# Patient Record
Sex: Female | Born: 1950 | Race: White | Hispanic: No | Marital: Single | Smoking: Never smoker
Health system: Southern US, Community
[De-identification: ages and names within clinical notes are randomized; demographics above are authoritative.]

## PROBLEM LIST (undated history)

## (undated) DIAGNOSIS — A6000 Herpesviral infection of urogenital system, unspecified: Secondary | ICD-10-CM

## (undated) DIAGNOSIS — E785 Hyperlipidemia, unspecified: Secondary | ICD-10-CM

## (undated) DIAGNOSIS — I341 Nonrheumatic mitral (valve) prolapse: Secondary | ICD-10-CM

## (undated) DIAGNOSIS — N952 Postmenopausal atrophic vaginitis: Secondary | ICD-10-CM

## (undated) DIAGNOSIS — J302 Other seasonal allergic rhinitis: Secondary | ICD-10-CM

## (undated) DIAGNOSIS — N183 Chronic kidney disease, stage 3 unspecified: Secondary | ICD-10-CM

## (undated) DIAGNOSIS — F419 Anxiety disorder, unspecified: Secondary | ICD-10-CM

## (undated) DIAGNOSIS — F4321 Adjustment disorder with depressed mood: Secondary | ICD-10-CM

## (undated) DIAGNOSIS — N3941 Urge incontinence: Secondary | ICD-10-CM

## (undated) DIAGNOSIS — N3010 Interstitial cystitis (chronic) without hematuria: Secondary | ICD-10-CM

## (undated) DIAGNOSIS — H9313 Tinnitus, bilateral: Secondary | ICD-10-CM

## (undated) DIAGNOSIS — J309 Allergic rhinitis, unspecified: Secondary | ICD-10-CM

## (undated) DIAGNOSIS — N3281 Overactive bladder: Secondary | ICD-10-CM

## (undated) DIAGNOSIS — I700 Atherosclerosis of aorta: Secondary | ICD-10-CM

## (undated) DIAGNOSIS — U071 COVID-19: Secondary | ICD-10-CM

## (undated) DIAGNOSIS — M170 Bilateral primary osteoarthritis of knee: Secondary | ICD-10-CM

## (undated) DIAGNOSIS — N951 Menopausal and female climacteric states: Secondary | ICD-10-CM

## (undated) DIAGNOSIS — I2510 Atherosclerotic heart disease of native coronary artery without angina pectoris: Secondary | ICD-10-CM

## (undated) HISTORY — DX: Menopausal and female climacteric states: N95.1

## (undated) HISTORY — PX: TONSILLECTOMY: SUR1361

## (undated) HISTORY — DX: Interstitial cystitis (chronic) without hematuria: N30.10

## (undated) HISTORY — DX: Atherosclerotic heart disease of native coronary artery without angina pectoris: I25.10

## (undated) HISTORY — DX: Chronic kidney disease, stage 3 unspecified: N18.30

## (undated) HISTORY — DX: Nonrheumatic mitral (valve) prolapse: I34.1

## (undated) HISTORY — DX: Atherosclerosis of aorta: I70.0

## (undated) HISTORY — DX: Allergic rhinitis, unspecified: J30.9

## (undated) HISTORY — DX: Anxiety disorder, unspecified: F41.9

## (undated) HISTORY — PX: WISDOM TOOTH EXTRACTION: SHX21

## (undated) HISTORY — DX: Hyperlipidemia, unspecified: E78.5

## (undated) HISTORY — DX: Urge incontinence: N39.41

## (undated) HISTORY — DX: Herpesviral infection of urogenital system, unspecified: A60.00

## (undated) HISTORY — DX: Overactive bladder: N32.81

## (undated) HISTORY — DX: Adjustment disorder with depressed mood: F43.21

## (undated) HISTORY — DX: Tinnitus, bilateral: H93.13

## (undated) HISTORY — DX: Other seasonal allergic rhinitis: J30.2

## (undated) HISTORY — DX: Postmenopausal atrophic vaginitis: N95.2

## (undated) HISTORY — DX: COVID-19: U07.1

## (undated) HISTORY — DX: Bilateral primary osteoarthritis of knee: M17.0

---

## 1997-12-04 ENCOUNTER — Encounter: Admission: RE | Admit: 1997-12-04 | Discharge: 1998-03-04 | Payer: Self-pay | Admitting: Family Medicine

## 1998-02-06 ENCOUNTER — Ambulatory Visit (HOSPITAL_COMMUNITY): Admission: RE | Admit: 1998-02-06 | Discharge: 1998-02-06 | Payer: Self-pay | Admitting: Obstetrics and Gynecology

## 1998-04-10 ENCOUNTER — Other Ambulatory Visit: Admission: RE | Admit: 1998-04-10 | Discharge: 1998-04-10 | Payer: Self-pay | Admitting: Obstetrics and Gynecology

## 1999-02-05 ENCOUNTER — Ambulatory Visit (HOSPITAL_COMMUNITY): Admission: RE | Admit: 1999-02-05 | Discharge: 1999-02-05 | Payer: Self-pay | Admitting: Obstetrics and Gynecology

## 1999-02-05 ENCOUNTER — Encounter: Payer: Self-pay | Admitting: Obstetrics and Gynecology

## 1999-03-06 ENCOUNTER — Encounter: Admission: RE | Admit: 1999-03-06 | Discharge: 1999-06-04 | Payer: Self-pay | Admitting: Family Medicine

## 1999-06-11 ENCOUNTER — Other Ambulatory Visit: Admission: RE | Admit: 1999-06-11 | Discharge: 1999-06-11 | Payer: Self-pay | Admitting: Obstetrics and Gynecology

## 1999-11-27 ENCOUNTER — Other Ambulatory Visit: Admission: RE | Admit: 1999-11-27 | Discharge: 1999-11-27 | Payer: Self-pay | Admitting: Obstetrics and Gynecology

## 1999-11-27 ENCOUNTER — Encounter (INDEPENDENT_AMBULATORY_CARE_PROVIDER_SITE_OTHER): Payer: Self-pay

## 2000-02-24 ENCOUNTER — Encounter: Admission: RE | Admit: 2000-02-24 | Discharge: 2000-04-12 | Payer: Self-pay | Admitting: Family Medicine

## 2000-02-25 ENCOUNTER — Encounter: Payer: Self-pay | Admitting: Obstetrics and Gynecology

## 2000-02-25 ENCOUNTER — Ambulatory Visit (HOSPITAL_COMMUNITY): Admission: RE | Admit: 2000-02-25 | Discharge: 2000-02-25 | Payer: Self-pay | Admitting: Obstetrics and Gynecology

## 2000-08-18 ENCOUNTER — Other Ambulatory Visit: Admission: RE | Admit: 2000-08-18 | Discharge: 2000-08-18 | Payer: Self-pay | Admitting: Obstetrics and Gynecology

## 2001-03-01 ENCOUNTER — Encounter: Payer: Self-pay | Admitting: Obstetrics and Gynecology

## 2001-03-01 ENCOUNTER — Ambulatory Visit (HOSPITAL_COMMUNITY): Admission: RE | Admit: 2001-03-01 | Discharge: 2001-03-01 | Payer: Self-pay | Admitting: Obstetrics and Gynecology

## 2001-09-20 ENCOUNTER — Ambulatory Visit (HOSPITAL_COMMUNITY): Admission: RE | Admit: 2001-09-20 | Discharge: 2001-09-20 | Payer: Self-pay | Admitting: Internal Medicine

## 2001-09-20 ENCOUNTER — Encounter: Payer: Self-pay | Admitting: *Deleted

## 2001-12-06 ENCOUNTER — Other Ambulatory Visit: Admission: RE | Admit: 2001-12-06 | Discharge: 2001-12-06 | Payer: Self-pay | Admitting: Obstetrics and Gynecology

## 2002-03-14 ENCOUNTER — Ambulatory Visit (HOSPITAL_COMMUNITY): Admission: RE | Admit: 2002-03-14 | Discharge: 2002-03-14 | Payer: Self-pay | Admitting: Obstetrics and Gynecology

## 2002-03-14 ENCOUNTER — Encounter: Payer: Self-pay | Admitting: Obstetrics and Gynecology

## 2002-12-08 ENCOUNTER — Other Ambulatory Visit: Admission: RE | Admit: 2002-12-08 | Discharge: 2002-12-08 | Payer: Self-pay | Admitting: Obstetrics and Gynecology

## 2003-03-27 ENCOUNTER — Ambulatory Visit (HOSPITAL_COMMUNITY): Admission: RE | Admit: 2003-03-27 | Discharge: 2003-03-27 | Payer: Self-pay | Admitting: Obstetrics and Gynecology

## 2003-03-27 ENCOUNTER — Encounter: Payer: Self-pay | Admitting: Obstetrics and Gynecology

## 2003-12-19 ENCOUNTER — Emergency Department (HOSPITAL_COMMUNITY): Admission: EM | Admit: 2003-12-19 | Discharge: 2003-12-19 | Payer: Self-pay | Admitting: *Deleted

## 2004-12-18 ENCOUNTER — Ambulatory Visit (HOSPITAL_COMMUNITY): Admission: RE | Admit: 2004-12-18 | Discharge: 2004-12-18 | Payer: Self-pay | Admitting: Obstetrics and Gynecology

## 2004-12-23 ENCOUNTER — Encounter: Admission: RE | Admit: 2004-12-23 | Discharge: 2004-12-23 | Payer: Self-pay | Admitting: Obstetrics and Gynecology

## 2005-02-16 ENCOUNTER — Other Ambulatory Visit: Admission: RE | Admit: 2005-02-16 | Discharge: 2005-02-16 | Payer: Self-pay | Admitting: Obstetrics and Gynecology

## 2006-02-10 ENCOUNTER — Ambulatory Visit (HOSPITAL_COMMUNITY): Admission: RE | Admit: 2006-02-10 | Discharge: 2006-02-10 | Payer: Self-pay | Admitting: Obstetrics and Gynecology

## 2006-03-18 ENCOUNTER — Other Ambulatory Visit: Admission: RE | Admit: 2006-03-18 | Discharge: 2006-03-18 | Payer: Self-pay | Admitting: Family Medicine

## 2007-02-15 ENCOUNTER — Ambulatory Visit (HOSPITAL_COMMUNITY): Admission: RE | Admit: 2007-02-15 | Discharge: 2007-02-15 | Payer: Self-pay | Admitting: Family Medicine

## 2007-04-19 ENCOUNTER — Other Ambulatory Visit: Admission: RE | Admit: 2007-04-19 | Discharge: 2007-04-19 | Payer: Self-pay | Admitting: Family Medicine

## 2008-02-17 ENCOUNTER — Ambulatory Visit (HOSPITAL_COMMUNITY): Admission: RE | Admit: 2008-02-17 | Discharge: 2008-02-17 | Payer: Self-pay | Admitting: Family Medicine

## 2008-04-23 ENCOUNTER — Other Ambulatory Visit: Admission: RE | Admit: 2008-04-23 | Discharge: 2008-04-23 | Payer: Self-pay | Admitting: Family Medicine

## 2009-02-21 ENCOUNTER — Ambulatory Visit (HOSPITAL_COMMUNITY): Admission: RE | Admit: 2009-02-21 | Discharge: 2009-02-21 | Payer: Self-pay | Admitting: Family Medicine

## 2010-02-27 ENCOUNTER — Ambulatory Visit (HOSPITAL_COMMUNITY): Admission: RE | Admit: 2010-02-27 | Discharge: 2010-02-27 | Payer: Self-pay | Admitting: Family Medicine

## 2010-08-24 ENCOUNTER — Encounter: Payer: Self-pay | Admitting: Obstetrics and Gynecology

## 2011-01-19 ENCOUNTER — Other Ambulatory Visit (HOSPITAL_COMMUNITY): Payer: Self-pay | Admitting: Family Medicine

## 2011-01-19 DIAGNOSIS — Z1231 Encounter for screening mammogram for malignant neoplasm of breast: Secondary | ICD-10-CM

## 2011-03-02 ENCOUNTER — Ambulatory Visit (HOSPITAL_COMMUNITY)
Admission: RE | Admit: 2011-03-02 | Discharge: 2011-03-02 | Disposition: A | Discharge status: Home / Self Care | Payer: BC Managed Care – PPO | Source: Ambulatory Visit | Attending: Family Medicine | Admitting: Family Medicine

## 2011-03-02 DIAGNOSIS — Z1231 Encounter for screening mammogram for malignant neoplasm of breast: Secondary | ICD-10-CM | POA: Insufficient documentation

## 2011-04-03 ENCOUNTER — Other Ambulatory Visit: Payer: Self-pay | Admitting: Family Medicine

## 2011-04-03 ENCOUNTER — Other Ambulatory Visit (HOSPITAL_COMMUNITY)
Admission: RE | Admit: 2011-04-03 | Discharge: 2011-04-03 | Disposition: A | Discharge status: Home / Self Care | Payer: BC Managed Care – PPO | Source: Ambulatory Visit | Attending: Family Medicine | Admitting: Family Medicine

## 2011-04-03 DIAGNOSIS — Z0000 Encounter for general adult medical examination without abnormal findings: Secondary | ICD-10-CM | POA: Insufficient documentation

## 2011-04-09 ENCOUNTER — Other Ambulatory Visit (HOSPITAL_COMMUNITY): Payer: Self-pay | Admitting: Family Medicine

## 2011-04-09 DIAGNOSIS — M8580 Other specified disorders of bone density and structure, unspecified site: Secondary | ICD-10-CM

## 2011-05-04 ENCOUNTER — Ambulatory Visit (HOSPITAL_COMMUNITY)
Admission: RE | Admit: 2011-05-04 | Discharge: 2011-05-04 | Disposition: A | Discharge status: Home / Self Care | Payer: BC Managed Care – PPO | Source: Ambulatory Visit | Attending: Family Medicine | Admitting: Family Medicine

## 2011-05-04 DIAGNOSIS — Z13820 Encounter for screening for osteoporosis: Secondary | ICD-10-CM | POA: Insufficient documentation

## 2011-05-04 DIAGNOSIS — M8580 Other specified disorders of bone density and structure, unspecified site: Secondary | ICD-10-CM

## 2011-05-04 DIAGNOSIS — Z780 Asymptomatic menopausal state: Secondary | ICD-10-CM | POA: Insufficient documentation

## 2012-02-09 ENCOUNTER — Other Ambulatory Visit (HOSPITAL_COMMUNITY): Payer: Self-pay | Admitting: Family Medicine

## 2012-02-09 DIAGNOSIS — Z1231 Encounter for screening mammogram for malignant neoplasm of breast: Secondary | ICD-10-CM

## 2012-03-07 ENCOUNTER — Ambulatory Visit (HOSPITAL_COMMUNITY)
Admission: RE | Admit: 2012-03-07 | Discharge: 2012-03-07 | Disposition: A | Discharge status: Home / Self Care | Payer: BC Managed Care – PPO | Source: Ambulatory Visit | Attending: Family Medicine | Admitting: Family Medicine

## 2012-03-07 ENCOUNTER — Ambulatory Visit (HOSPITAL_COMMUNITY): Payer: BC Managed Care – PPO

## 2012-03-07 DIAGNOSIS — Z1231 Encounter for screening mammogram for malignant neoplasm of breast: Secondary | ICD-10-CM | POA: Insufficient documentation

## 2012-03-22 ENCOUNTER — Other Ambulatory Visit: Payer: Self-pay | Admitting: Family Medicine

## 2012-03-22 ENCOUNTER — Ambulatory Visit
Admission: RE | Admit: 2012-03-22 | Discharge: 2012-03-22 | Disposition: A | Discharge status: Home / Self Care | Payer: BC Managed Care – PPO | Source: Ambulatory Visit | Attending: Family Medicine | Admitting: Family Medicine

## 2012-03-22 DIAGNOSIS — M542 Cervicalgia: Secondary | ICD-10-CM

## 2012-05-05 ENCOUNTER — Other Ambulatory Visit: Payer: Self-pay | Admitting: Physician Assistant

## 2012-05-05 ENCOUNTER — Other Ambulatory Visit (HOSPITAL_COMMUNITY)
Admission: RE | Admit: 2012-05-05 | Discharge: 2012-05-05 | Disposition: A | Discharge status: Home / Self Care | Payer: BC Managed Care – PPO | Source: Ambulatory Visit | Attending: Family Medicine | Admitting: Family Medicine

## 2012-05-05 DIAGNOSIS — Z0000 Encounter for general adult medical examination without abnormal findings: Secondary | ICD-10-CM | POA: Insufficient documentation

## 2013-02-20 ENCOUNTER — Other Ambulatory Visit (HOSPITAL_COMMUNITY): Payer: Self-pay | Admitting: Family Medicine

## 2013-02-20 DIAGNOSIS — Z1231 Encounter for screening mammogram for malignant neoplasm of breast: Secondary | ICD-10-CM

## 2013-03-13 ENCOUNTER — Ambulatory Visit (HOSPITAL_COMMUNITY)
Admission: RE | Admit: 2013-03-13 | Discharge: 2013-03-13 | Disposition: A | Discharge status: Home / Self Care | Payer: BC Managed Care – PPO | Source: Ambulatory Visit | Attending: Family Medicine | Admitting: Family Medicine

## 2013-03-13 DIAGNOSIS — Z1231 Encounter for screening mammogram for malignant neoplasm of breast: Secondary | ICD-10-CM | POA: Insufficient documentation

## 2013-05-15 ENCOUNTER — Other Ambulatory Visit: Payer: Self-pay | Admitting: Family Medicine

## 2013-05-15 ENCOUNTER — Ambulatory Visit
Admission: RE | Admit: 2013-05-15 | Discharge: 2013-05-15 | Disposition: A | Discharge status: Home / Self Care | Payer: BC Managed Care – PPO | Source: Ambulatory Visit | Attending: Family Medicine | Admitting: Family Medicine

## 2013-05-15 DIAGNOSIS — R52 Pain, unspecified: Secondary | ICD-10-CM

## 2013-10-16 ENCOUNTER — Ambulatory Visit
Admission: RE | Admit: 2013-10-16 | Discharge: 2013-10-16 | Disposition: A | Discharge status: Home / Self Care | Payer: BC Managed Care – PPO | Source: Ambulatory Visit | Attending: Family Medicine | Admitting: Family Medicine

## 2013-10-16 ENCOUNTER — Other Ambulatory Visit: Payer: Self-pay | Admitting: Family Medicine

## 2013-10-16 DIAGNOSIS — R059 Cough, unspecified: Secondary | ICD-10-CM

## 2013-10-16 DIAGNOSIS — R05 Cough: Secondary | ICD-10-CM

## 2014-02-13 ENCOUNTER — Other Ambulatory Visit (HOSPITAL_COMMUNITY): Payer: Self-pay | Admitting: Family Medicine

## 2014-02-13 DIAGNOSIS — Z1231 Encounter for screening mammogram for malignant neoplasm of breast: Secondary | ICD-10-CM

## 2014-03-19 ENCOUNTER — Ambulatory Visit (HOSPITAL_COMMUNITY): Payer: BC Managed Care – PPO

## 2014-03-23 ENCOUNTER — Ambulatory Visit (HOSPITAL_COMMUNITY)
Admission: RE | Admit: 2014-03-23 | Discharge: 2014-03-23 | Disposition: A | Discharge status: Home / Self Care | Payer: BC Managed Care – PPO | Source: Ambulatory Visit | Attending: Family Medicine | Admitting: Family Medicine

## 2014-03-23 DIAGNOSIS — Z1231 Encounter for screening mammogram for malignant neoplasm of breast: Secondary | ICD-10-CM | POA: Insufficient documentation

## 2015-03-18 ENCOUNTER — Other Ambulatory Visit (HOSPITAL_COMMUNITY): Payer: Self-pay | Admitting: Family Medicine

## 2015-03-18 DIAGNOSIS — Z1231 Encounter for screening mammogram for malignant neoplasm of breast: Secondary | ICD-10-CM

## 2015-03-25 ENCOUNTER — Ambulatory Visit (HOSPITAL_COMMUNITY)
Admission: RE | Admit: 2015-03-25 | Discharge: 2015-03-25 | Disposition: A | Discharge status: Home / Self Care | Payer: BC Managed Care – PPO | Source: Ambulatory Visit | Attending: Family Medicine | Admitting: Family Medicine

## 2015-03-25 DIAGNOSIS — Z1231 Encounter for screening mammogram for malignant neoplasm of breast: Secondary | ICD-10-CM | POA: Diagnosis not present

## 2015-12-17 ENCOUNTER — Other Ambulatory Visit (HOSPITAL_COMMUNITY)
Admission: RE | Admit: 2015-12-17 | Discharge: 2015-12-17 | Disposition: A | Discharge status: Home / Self Care | Payer: BC Managed Care – PPO | Source: Ambulatory Visit | Attending: Family Medicine | Admitting: Family Medicine

## 2015-12-17 ENCOUNTER — Other Ambulatory Visit: Payer: Self-pay | Admitting: Family Medicine

## 2015-12-17 DIAGNOSIS — Z124 Encounter for screening for malignant neoplasm of cervix: Secondary | ICD-10-CM | POA: Insufficient documentation

## 2015-12-18 LAB — CYTOLOGY - PAP

## 2016-03-03 ENCOUNTER — Other Ambulatory Visit: Payer: Self-pay | Admitting: Family Medicine

## 2016-03-03 DIAGNOSIS — Z1231 Encounter for screening mammogram for malignant neoplasm of breast: Secondary | ICD-10-CM

## 2016-04-01 ENCOUNTER — Other Ambulatory Visit: Payer: Self-pay | Admitting: Family Medicine

## 2016-04-01 ENCOUNTER — Ambulatory Visit
Admission: RE | Admit: 2016-04-01 | Discharge: 2016-04-01 | Disposition: A | Discharge status: Home / Self Care | Payer: BC Managed Care – PPO | Source: Ambulatory Visit | Attending: Family Medicine | Admitting: Family Medicine

## 2016-04-01 DIAGNOSIS — Z1231 Encounter for screening mammogram for malignant neoplasm of breast: Secondary | ICD-10-CM

## 2016-08-03 HISTORY — PX: KNEE SURGERY: SHX244

## 2016-08-18 ENCOUNTER — Other Ambulatory Visit: Payer: Self-pay | Admitting: Family Medicine

## 2016-08-18 ENCOUNTER — Ambulatory Visit
Admission: RE | Admit: 2016-08-18 | Discharge: 2016-08-18 | Disposition: A | Discharge status: Home / Self Care | Payer: BC Managed Care – PPO | Source: Ambulatory Visit | Attending: Family Medicine | Admitting: Family Medicine

## 2016-08-18 DIAGNOSIS — R5383 Other fatigue: Secondary | ICD-10-CM

## 2016-08-18 DIAGNOSIS — R0981 Nasal congestion: Secondary | ICD-10-CM

## 2016-08-19 ENCOUNTER — Other Ambulatory Visit: Payer: BC Managed Care – PPO

## 2017-03-08 ENCOUNTER — Ambulatory Visit (INDEPENDENT_AMBULATORY_CARE_PROVIDER_SITE_OTHER): Payer: Medicare Other | Admitting: Sports Medicine

## 2017-03-08 VITALS — BP 108/68 | Ht 61.0 in | Wt 125.0 lb

## 2017-03-08 DIAGNOSIS — M25562 Pain in left knee: Secondary | ICD-10-CM | POA: Diagnosis not present

## 2017-03-08 NOTE — Progress Notes (Signed)
   Subjective:    Patient ID: Angel Warren, female    DOB: 05/19/1951, 66 y.o.   MRN: [id number]  HPI  L knee pain: 66 yo female presenting with left knee pain following a hiking trip.  [place].  On the first day, she stepped up onto a high rock ledge with her left leg and felt a sudden pop behind her left knee.  Felt pain radiate to the medial aspect of her knee and around to the front.  She sat down for a while to rest.  Took some Tylenol and borrowed a knee brace another hiker had.  She continued the hike for the remaining 5 days and notes her knee felt like it as going to collapse on her.  Felt "loose and unstable".  It became difficult to bear weight and this worsened with each day.  By the end of her trip, felt overall twingy in nature but not all the time.  [facility] [place] [place] realized she could not run on it and it felt a little stiff.   She has recently moved and has been unloading boxes at home but unable to bear weight.  No catching/locking sensation noted. She has been limping which is more noticeable at the end of the day. She has been icing it and elevates it at home.  Notes there is sometimes swelling in her left lower leg and foot.    Review of Systems  Cardiovascular: Positive for leg swelling.  Musculoskeletal: Negative for joint swelling.  Neurological: Negative for numbness.      Objective:   Physical Exam  BP 108/68   Ht 5\' 1"  (1.549 m)   Wt 125 lb (56.7 kg)   BMI 23.62 kg/m   66 yo female, NAD L knee:  Normal to inspection with no erythema or obvious bony abnormalities. Mild effusion noted with palpation.  No warmth or patellar tenderness. +Medial joint line tenderness. ROM normal in flexion and extension and lower leg rotation. Ligaments with solid consistent endpoints including ACL, PCL, LCL, MCL. Negative Mcmurray's and +Thessaly's testing.  Non painful  patellar compression.  Patellar and quadriceps tendons unremarkable.     Assessment & Plan:   L knee pain:  Findings likely consistent with arthritis vs possible meniscal tear.  Ultrasound performed in office showing small Baker's cyst and bulging medial meniscus.  OA also likely as evidenced by bone spur on U/S.   -Ordered XR L knee  - AP, lateral, sunrise and weight-bearing -Recommend activity modification; light walking and biking -Continue compression sleeve, use Aleve in moderation, and continue to apply ice with massage -Recommended isometric quad strengthening exercises -Will follow up in 3-4 weeks but will call with x-ray results when available. -Could consider cortisone injection if persists at next visit  Patient seen and evaluated with the resident. I agree with the above plan of care. Patient likely has a degenerative medial meniscal tear. We will get some x-rays to determine the degree of osteoarthritis present. Proceed with treatment as above and follow-up in 4 weeks. I did discuss an intra-articular cortisone injection if symptoms persist.

## 2017-03-08 NOTE — Patient Instructions (Signed)
You were seen in clinic today for your left knee pain which, as we discussed, is likely due to either arthritis or a possible meniscal tear.  We performed ultrasound and did find what is called a Baker's cyst which can cause some leg swelling as you have experienced.  You can continue physical activity however would recommend light walking and biking as opposed to more strenuous hiking.  Use the compression sleeve over your knee.    Additionally, you can continue to use ice and Aleve as needed.   We ordered X-rays today and Dr. Margaretha Sheffieldraper will follow up with you once he has your results.  We will see you back in about 3-4 weeks.

## 2017-03-08 NOTE — Assessment & Plan Note (Signed)
Findings likely consistent with arthritis vs possible meniscal tear.  Ultrasound performed in office showing small Baker's cyst.  OA also likely as evidenced by bone spur on U/S.   -Ordered XR L knee  - AP, lateral, sunrise and weight-bearing -Recommend activity modification; light walking and biking -Continue compression sleeve, use Aleve in moderation, and continue to apply ice with massage -Recommended quad strengthening exercises -Will follow up in 3-4 weeks  -Could consider cortisone injection if persists at next visit

## 2017-03-09 ENCOUNTER — Ambulatory Visit
Admission: RE | Admit: 2017-03-09 | Discharge: 2017-03-09 | Disposition: A | Discharge status: Home / Self Care | Payer: Medicare Other | Source: Ambulatory Visit | Attending: Sports Medicine | Admitting: Sports Medicine

## 2017-03-09 DIAGNOSIS — M25562 Pain in left knee: Secondary | ICD-10-CM

## 2017-03-10 ENCOUNTER — Telehealth: Payer: Self-pay | Admitting: Sports Medicine

## 2017-03-10 NOTE — Telephone Encounter (Signed)
  I spoke with the patient on the phone today after reviewing x-rays of her left knee. 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 Clinically, I think she likely has a degenerative meniscal tear as well. If symptoms persist at follow-up then I would consider a cortisone injection prior to further diagnostic imaging.

## 2017-03-11 ENCOUNTER — Ambulatory Visit: Payer: BC Managed Care – PPO | Admitting: Sports Medicine

## 2017-03-11 ENCOUNTER — Other Ambulatory Visit: Payer: Self-pay | Admitting: Family Medicine

## 2017-03-11 DIAGNOSIS — Z1231 Encounter for screening mammogram for malignant neoplasm of breast: Secondary | ICD-10-CM

## 2017-04-01 ENCOUNTER — Ambulatory Visit (INDEPENDENT_AMBULATORY_CARE_PROVIDER_SITE_OTHER): Payer: Medicare Other | Admitting: Sports Medicine

## 2017-04-01 ENCOUNTER — Encounter: Payer: Self-pay | Admitting: Sports Medicine

## 2017-04-01 VITALS — BP 114/60 | Ht 61.0 in | Wt 125.0 lb

## 2017-04-01 DIAGNOSIS — M25562 Pain in left knee: Secondary | ICD-10-CM

## 2017-04-01 NOTE — Progress Notes (Signed)
   Subjective:    Patient ID: Angel Warren, female    DOB: 01/02/1951, 66 y.o.   MRN: [id number]  HPI 66 yo Woman presenting for follow-up Left knee pain. Her initial injury occurred while hiking when she noticed a "pop" while walking. She denies effusion immediately following this event, but did have some swelling over the next day that did not limit her hiking. Last visit she was treated for OA Flare v. Meniscal injury and was adherent to activity modification. Her Left knee pain subsided drastically after two weeks of activity modification (Swimming & Yoga). Last Thursday/Friday she went on two hikes 2 & 4 miles respectively on [place] without new footwear. Her pain returned over last weekend after these hikes. The pain is posterolateral along the knee, non-radiating, worsened with activity, better with rest, and is similar to the pain prompting her initial presentation. She denies new injury, numbness/tingling, weakness. She would like to investigate further through MRI.   Review of Systems  Constitutional: Negative for activity change and fatigue.  Respiratory: Negative for chest tightness and shortness of breath.   Cardiovascular: Negative for leg swelling.  Musculoskeletal: Negative for gait problem and joint swelling.       +L Knee pain  Skin: Negative for color change and rash.  Neurological: Negative for weakness and numbness.       Objective:   Physical Exam  Constitutional: She is oriented to person, place, and time. She appears well-developed and well-nourished. No distress.  HENT:  Head: Normocephalic and atraumatic.  Eyes: Pupils are equal, round, and reactive to light.  Cardiovascular: Normal rate and intact distal pulses.   Pulmonary/Chest: Effort normal. No respiratory distress.  Musculoskeletal:  Left Knee without erythema or rash, there is a mild joint effusion on Left Non-tender to palpation: Lateral joint line, Sup/Inf patellar poles, Med/Lat  patellar facets Tender to palpation: Medial Joint Line ROM: Popliteal angle 30 bilaterally, 120 degrees flexion bilaterally Strength: Flex/Ext 5/5 b/l Knee Distal pulses intact b/l, Gross sensation intact b/l Thesally Neg, McMurrays Neg, Varus/Valgus at 0-20 deg Neg  Neurological: She is alert and oriented to person, place, and time.  Skin: Skin is warm. No rash noted. She is not diaphoretic. No erythema.       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 We will proceed with L Knee MRI at this time along with continued Isometric exercises. Feel she will benefit from continued activity modification in the meantime. We discussed possibility of Corticosteroid injection, however deferred at this time pending MRI result. Will call once resulted, follow up in 4 weeks to discuss results and further interventions.

## 2017-04-04 ENCOUNTER — Ambulatory Visit
Admission: RE | Admit: 2017-04-04 | Discharge: 2017-04-04 | Disposition: A | Discharge status: Home / Self Care | Payer: Medicare Other | Source: Ambulatory Visit | Attending: Sports Medicine | Admitting: Sports Medicine

## 2017-04-04 DIAGNOSIS — M25562 Pain in left knee: Secondary | ICD-10-CM

## 2017-04-06 ENCOUNTER — Ambulatory Visit
Admission: RE | Admit: 2017-04-06 | Discharge: 2017-04-06 | Disposition: A | Discharge status: Home / Self Care | Payer: Medicare Other | Source: Ambulatory Visit | Attending: Family Medicine | Admitting: Family Medicine

## 2017-04-06 ENCOUNTER — Telehealth: Payer: Self-pay | Admitting: Sports Medicine

## 2017-04-06 DIAGNOSIS — Z1231 Encounter for screening mammogram for malignant neoplasm of breast: Secondary | ICD-10-CM

## 2017-04-06 NOTE — Telephone Encounter (Signed)
I spoke with the patient on the phone today after reviewing the MRI of her left knee. She has a complex medial meniscal tear. She also has some mild degenerative changes. I recommend consultation with Dr. Thurston HoleWainer to discuss merits of knee arthroscopy and meniscectomy. Definitive treatment as per Dr. Sherene SiresWainer's discretion. Patient will follow-up with me as needed.

## 2017-04-08 ENCOUNTER — Other Ambulatory Visit: Payer: Medicare Other

## 2017-08-30 ENCOUNTER — Other Ambulatory Visit: Payer: Self-pay | Admitting: Family Medicine

## 2017-08-30 DIAGNOSIS — R102 Pelvic and perineal pain: Secondary | ICD-10-CM

## 2017-09-02 ENCOUNTER — Ambulatory Visit
Admission: RE | Admit: 2017-09-02 | Discharge: 2017-09-02 | Disposition: A | Discharge status: Home / Self Care | Payer: Medicare Other | Source: Ambulatory Visit | Attending: Family Medicine | Admitting: Family Medicine

## 2017-09-02 DIAGNOSIS — R102 Pelvic and perineal pain: Secondary | ICD-10-CM

## 2017-10-11 ENCOUNTER — Ambulatory Visit (INDEPENDENT_AMBULATORY_CARE_PROVIDER_SITE_OTHER): Payer: Medicare Other | Admitting: Licensed Clinical Social Worker

## 2017-10-11 DIAGNOSIS — F330 Major depressive disorder, recurrent, mild: Secondary | ICD-10-CM

## 2017-10-25 ENCOUNTER — Ambulatory Visit: Payer: Medicare Other | Admitting: Licensed Clinical Social Worker

## 2017-11-08 ENCOUNTER — Ambulatory Visit: Payer: Medicare Other | Admitting: Licensed Clinical Social Worker

## 2017-11-22 ENCOUNTER — Ambulatory Visit (INDEPENDENT_AMBULATORY_CARE_PROVIDER_SITE_OTHER): Payer: Medicare Other | Admitting: Licensed Clinical Social Worker

## 2017-11-22 DIAGNOSIS — F331 Major depressive disorder, recurrent, moderate: Secondary | ICD-10-CM

## 2017-11-24 ENCOUNTER — Other Ambulatory Visit: Payer: Self-pay | Admitting: Family Medicine

## 2017-11-24 ENCOUNTER — Ambulatory Visit
Admission: RE | Admit: 2017-11-24 | Discharge: 2017-11-24 | Disposition: A | Discharge status: Home / Self Care | Payer: Medicare Other | Source: Ambulatory Visit | Attending: Family Medicine | Admitting: Family Medicine

## 2017-11-24 DIAGNOSIS — R059 Cough, unspecified: Secondary | ICD-10-CM

## 2017-11-24 DIAGNOSIS — R05 Cough: Secondary | ICD-10-CM

## 2017-11-24 IMAGING — DX DG CHEST 2V
2 series · 2 of 2 positions shown · non-contrast
Comparison: [DATE]

CLINICAL DATA: Productive cough for 3 months

EXAM:
CHEST - 2 VIEW

[dg chest 2 view (1 of 2)]
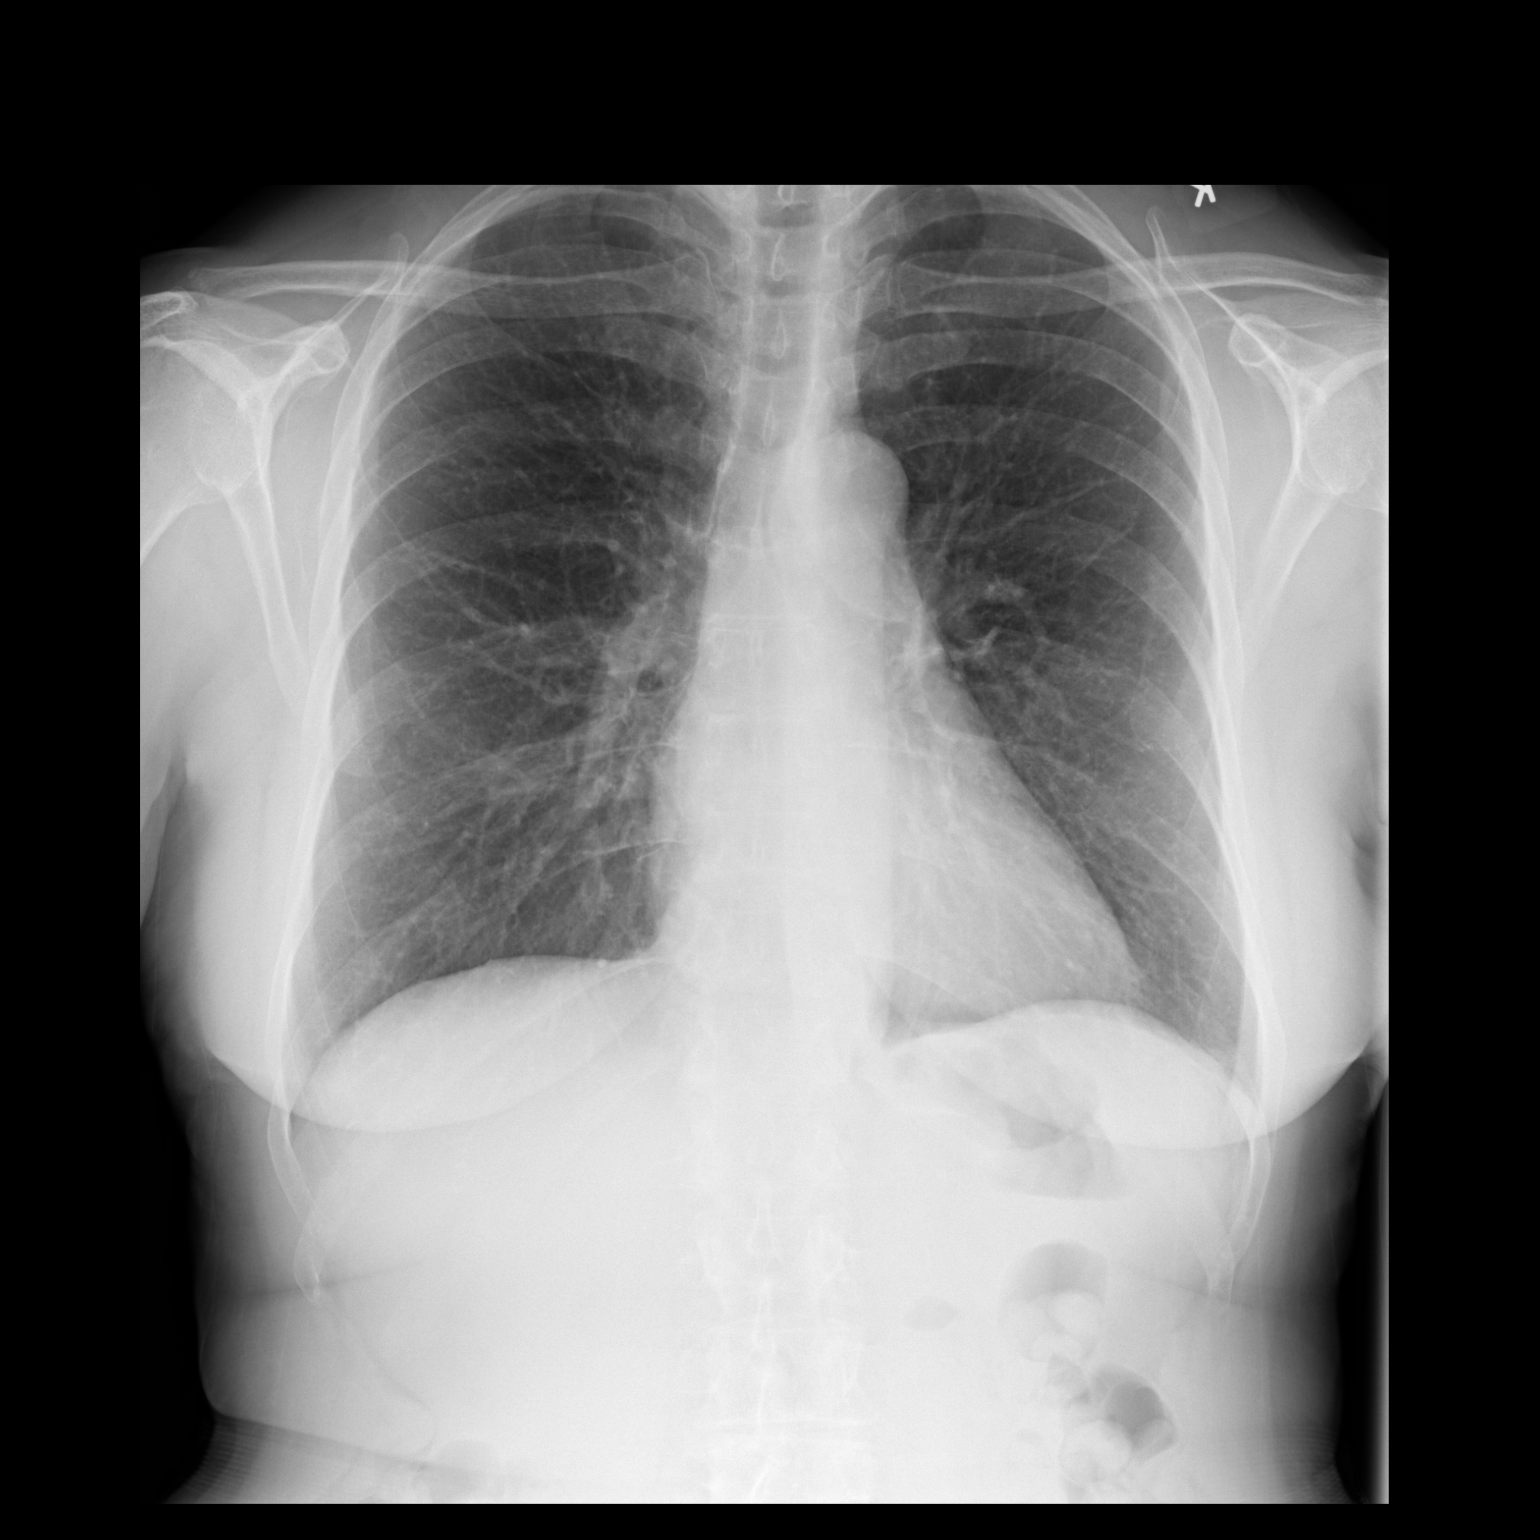

[dg chest 2 view (2 of 2)]
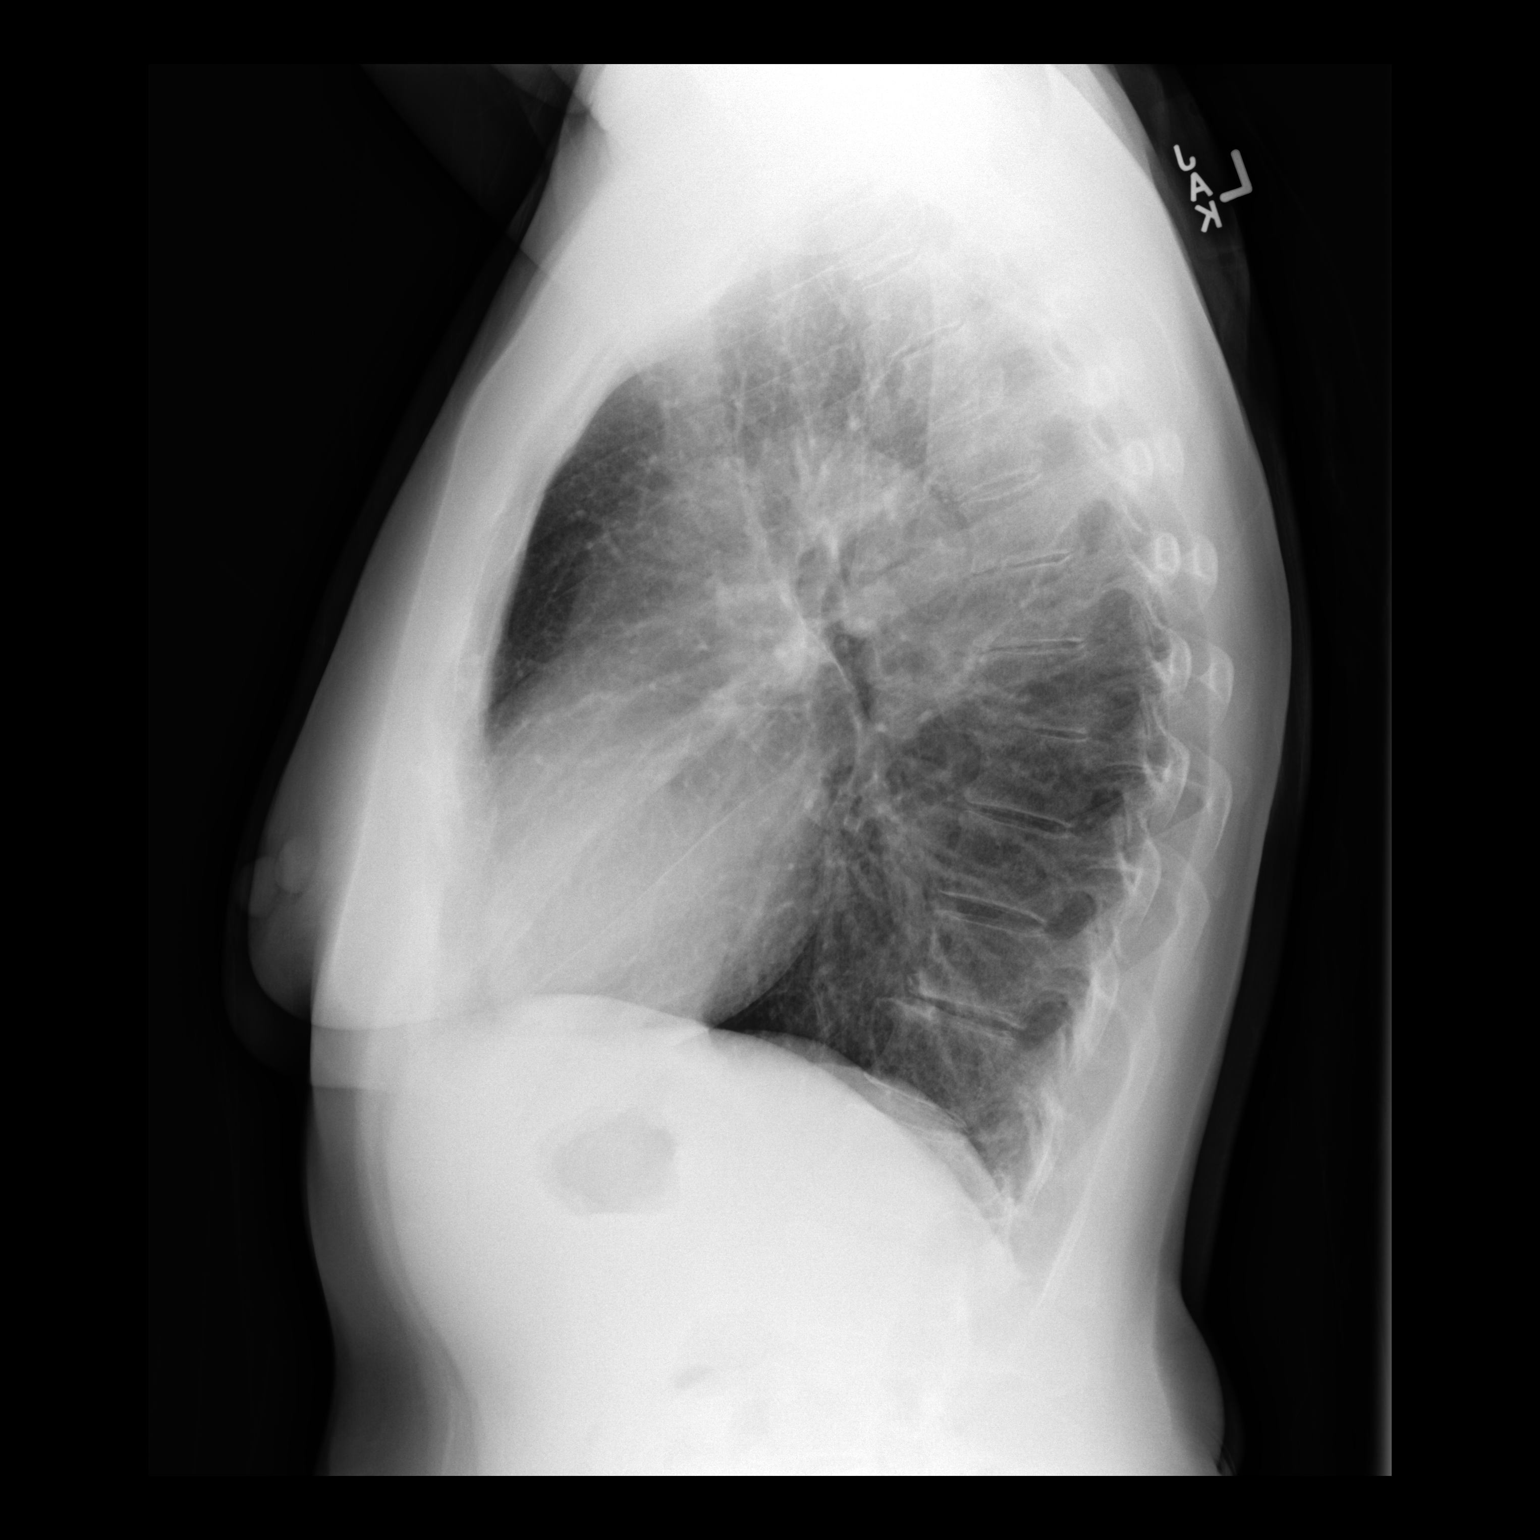

[2 of 2 positions shown; findings below may reference images not displayed]

FINDINGS: Upper normal heart size. Lungs hyperaerated and clear. Stable
interstitial prominence. Normal vascularity. No pneumothorax or
pleural effusion.
IMPRESSION: No active cardiopulmonary disease.

## 2017-12-06 ENCOUNTER — Ambulatory Visit (INDEPENDENT_AMBULATORY_CARE_PROVIDER_SITE_OTHER): Payer: Medicare Other | Admitting: Licensed Clinical Social Worker

## 2017-12-06 DIAGNOSIS — F3341 Major depressive disorder, recurrent, in partial remission: Secondary | ICD-10-CM | POA: Diagnosis not present

## 2018-01-03 ENCOUNTER — Ambulatory Visit (INDEPENDENT_AMBULATORY_CARE_PROVIDER_SITE_OTHER): Payer: Medicare Other | Admitting: Licensed Clinical Social Worker

## 2018-01-03 DIAGNOSIS — F3341 Major depressive disorder, recurrent, in partial remission: Secondary | ICD-10-CM

## 2018-02-01 ENCOUNTER — Ambulatory Visit: Payer: Medicare Other | Admitting: Licensed Clinical Social Worker

## 2018-02-28 ENCOUNTER — Other Ambulatory Visit: Payer: Self-pay | Admitting: Family Medicine

## 2018-02-28 DIAGNOSIS — Z1231 Encounter for screening mammogram for malignant neoplasm of breast: Secondary | ICD-10-CM

## 2018-04-08 ENCOUNTER — Ambulatory Visit
Admission: RE | Admit: 2018-04-08 | Discharge: 2018-04-08 | Disposition: A | Discharge status: Home / Self Care | Payer: Medicare Other | Source: Ambulatory Visit | Attending: Family Medicine | Admitting: Family Medicine

## 2018-04-08 DIAGNOSIS — Z1231 Encounter for screening mammogram for malignant neoplasm of breast: Secondary | ICD-10-CM

## 2019-02-21 ENCOUNTER — Other Ambulatory Visit: Payer: Self-pay | Admitting: *Deleted

## 2019-02-21 DIAGNOSIS — Z20822 Contact with and (suspected) exposure to covid-19: Secondary | ICD-10-CM

## 2019-05-10 ENCOUNTER — Other Ambulatory Visit: Payer: Self-pay | Admitting: Family Medicine

## 2019-05-10 DIAGNOSIS — Z1231 Encounter for screening mammogram for malignant neoplasm of breast: Secondary | ICD-10-CM

## 2019-05-12 ENCOUNTER — Ambulatory Visit: Payer: Medicare Other

## 2019-05-16 ENCOUNTER — Other Ambulatory Visit: Payer: Self-pay

## 2019-05-16 ENCOUNTER — Ambulatory Visit
Admission: RE | Admit: 2019-05-16 | Discharge: 2019-05-16 | Disposition: A | Discharge status: Home / Self Care | Payer: Medicare Other | Source: Ambulatory Visit | Attending: Family Medicine | Admitting: Family Medicine

## 2019-05-16 DIAGNOSIS — Z1231 Encounter for screening mammogram for malignant neoplasm of breast: Secondary | ICD-10-CM

## 2019-07-19 ENCOUNTER — Other Ambulatory Visit: Payer: Self-pay | Admitting: Family Medicine

## 2019-07-19 DIAGNOSIS — M8580 Other specified disorders of bone density and structure, unspecified site: Secondary | ICD-10-CM

## 2019-09-03 ENCOUNTER — Ambulatory Visit: Payer: Medicare Other

## 2019-09-10 ENCOUNTER — Ambulatory Visit: Payer: Medicare Other

## 2019-10-02 ENCOUNTER — Ambulatory Visit
Admission: RE | Admit: 2019-10-02 | Discharge: 2019-10-02 | Disposition: A | Discharge status: Home / Self Care | Payer: Medicare PPO | Source: Ambulatory Visit | Attending: Family Medicine | Admitting: Family Medicine

## 2019-10-02 ENCOUNTER — Other Ambulatory Visit: Payer: Self-pay

## 2019-10-02 DIAGNOSIS — M8580 Other specified disorders of bone density and structure, unspecified site: Secondary | ICD-10-CM

## 2019-10-23 ENCOUNTER — Ambulatory Visit
Admission: RE | Admit: 2019-10-23 | Discharge: 2019-10-23 | Disposition: A | Discharge status: Home / Self Care | Payer: Medicare PPO | Source: Ambulatory Visit | Attending: Family Medicine | Admitting: Family Medicine

## 2019-10-23 ENCOUNTER — Other Ambulatory Visit: Payer: Self-pay | Admitting: Family Medicine

## 2019-10-23 DIAGNOSIS — M25571 Pain in right ankle and joints of right foot: Secondary | ICD-10-CM

## 2019-12-12 DIAGNOSIS — M62838 Other muscle spasm: Secondary | ICD-10-CM | POA: Diagnosis not present

## 2019-12-12 DIAGNOSIS — S060X0D Concussion without loss of consciousness, subsequent encounter: Secondary | ICD-10-CM | POA: Diagnosis not present

## 2019-12-12 DIAGNOSIS — F439 Reaction to severe stress, unspecified: Secondary | ICD-10-CM | POA: Diagnosis not present

## 2019-12-15 DIAGNOSIS — M542 Cervicalgia: Secondary | ICD-10-CM | POA: Diagnosis not present

## 2019-12-15 DIAGNOSIS — M62838 Other muscle spasm: Secondary | ICD-10-CM | POA: Diagnosis not present

## 2019-12-19 DIAGNOSIS — M542 Cervicalgia: Secondary | ICD-10-CM | POA: Diagnosis not present

## 2019-12-19 DIAGNOSIS — M62838 Other muscle spasm: Secondary | ICD-10-CM | POA: Diagnosis not present

## 2019-12-20 DIAGNOSIS — M542 Cervicalgia: Secondary | ICD-10-CM | POA: Diagnosis not present

## 2019-12-20 DIAGNOSIS — M62838 Other muscle spasm: Secondary | ICD-10-CM | POA: Diagnosis not present

## 2020-01-09 DIAGNOSIS — H9313 Tinnitus, bilateral: Secondary | ICD-10-CM | POA: Diagnosis not present

## 2020-01-09 DIAGNOSIS — M62838 Other muscle spasm: Secondary | ICD-10-CM | POA: Diagnosis not present

## 2020-01-23 DIAGNOSIS — M542 Cervicalgia: Secondary | ICD-10-CM | POA: Diagnosis not present

## 2020-01-23 DIAGNOSIS — M62838 Other muscle spasm: Secondary | ICD-10-CM | POA: Diagnosis not present

## 2020-01-25 DIAGNOSIS — M542 Cervicalgia: Secondary | ICD-10-CM | POA: Diagnosis not present

## 2020-01-25 DIAGNOSIS — M62838 Other muscle spasm: Secondary | ICD-10-CM | POA: Diagnosis not present

## 2020-02-06 DIAGNOSIS — M62838 Other muscle spasm: Secondary | ICD-10-CM | POA: Diagnosis not present

## 2020-02-06 DIAGNOSIS — M542 Cervicalgia: Secondary | ICD-10-CM | POA: Diagnosis not present

## 2020-02-17 DIAGNOSIS — J329 Chronic sinusitis, unspecified: Secondary | ICD-10-CM | POA: Diagnosis not present

## 2020-03-02 DIAGNOSIS — J069 Acute upper respiratory infection, unspecified: Secondary | ICD-10-CM | POA: Diagnosis not present

## 2020-03-02 DIAGNOSIS — R05 Cough: Secondary | ICD-10-CM | POA: Diagnosis not present

## 2020-04-01 DIAGNOSIS — Z20822 Contact with and (suspected) exposure to covid-19: Secondary | ICD-10-CM | POA: Diagnosis not present

## 2020-04-02 DIAGNOSIS — M170 Bilateral primary osteoarthritis of knee: Secondary | ICD-10-CM | POA: Diagnosis not present

## 2020-04-09 DIAGNOSIS — M170 Bilateral primary osteoarthritis of knee: Secondary | ICD-10-CM | POA: Diagnosis not present

## 2020-04-14 DIAGNOSIS — R319 Hematuria, unspecified: Secondary | ICD-10-CM | POA: Diagnosis not present

## 2020-04-14 DIAGNOSIS — N390 Urinary tract infection, site not specified: Secondary | ICD-10-CM | POA: Diagnosis not present

## 2020-04-16 DIAGNOSIS — M170 Bilateral primary osteoarthritis of knee: Secondary | ICD-10-CM | POA: Diagnosis not present

## 2020-04-23 ENCOUNTER — Other Ambulatory Visit (HOSPITAL_COMMUNITY)
Admission: RE | Admit: 2020-04-23 | Discharge: 2020-04-23 | Disposition: A | Discharge status: Home / Self Care | Payer: Medicare PPO | Source: Ambulatory Visit | Attending: Family Medicine | Admitting: Family Medicine

## 2020-04-23 ENCOUNTER — Other Ambulatory Visit: Payer: Self-pay | Admitting: Family Medicine

## 2020-04-23 DIAGNOSIS — N3010 Interstitial cystitis (chronic) without hematuria: Secondary | ICD-10-CM | POA: Diagnosis not present

## 2020-04-23 DIAGNOSIS — E785 Hyperlipidemia, unspecified: Secondary | ICD-10-CM | POA: Diagnosis not present

## 2020-04-23 DIAGNOSIS — N183 Chronic kidney disease, stage 3 unspecified: Secondary | ICD-10-CM | POA: Diagnosis not present

## 2020-04-23 DIAGNOSIS — Z124 Encounter for screening for malignant neoplasm of cervix: Secondary | ICD-10-CM | POA: Diagnosis not present

## 2020-04-23 DIAGNOSIS — H1013 Acute atopic conjunctivitis, bilateral: Secondary | ICD-10-CM | POA: Diagnosis not present

## 2020-04-23 DIAGNOSIS — N3281 Overactive bladder: Secondary | ICD-10-CM | POA: Diagnosis not present

## 2020-04-23 DIAGNOSIS — M8588 Other specified disorders of bone density and structure, other site: Secondary | ICD-10-CM | POA: Diagnosis not present

## 2020-04-23 DIAGNOSIS — Z23 Encounter for immunization: Secondary | ICD-10-CM | POA: Diagnosis not present

## 2020-04-23 DIAGNOSIS — Z0000 Encounter for general adult medical examination without abnormal findings: Secondary | ICD-10-CM | POA: Diagnosis not present

## 2020-04-23 DIAGNOSIS — A6000 Herpesviral infection of urogenital system, unspecified: Secondary | ICD-10-CM | POA: Diagnosis not present

## 2020-04-25 ENCOUNTER — Other Ambulatory Visit: Payer: Self-pay | Admitting: Family Medicine

## 2020-04-25 DIAGNOSIS — Z1231 Encounter for screening mammogram for malignant neoplasm of breast: Secondary | ICD-10-CM

## 2020-04-25 LAB — CYTOLOGY - PAP: Diagnosis: NEGATIVE

## 2020-05-20 ENCOUNTER — Ambulatory Visit: Payer: Medicare PPO

## 2020-05-20 DIAGNOSIS — H66002 Acute suppurative otitis media without spontaneous rupture of ear drum, left ear: Secondary | ICD-10-CM | POA: Diagnosis not present

## 2020-06-06 DIAGNOSIS — H90A32 Mixed conductive and sensorineural hearing loss, unilateral, left ear with restricted hearing on the contralateral side: Secondary | ICD-10-CM | POA: Diagnosis not present

## 2020-06-06 DIAGNOSIS — H9191 Unspecified hearing loss, right ear: Secondary | ICD-10-CM | POA: Diagnosis not present

## 2020-06-18 ENCOUNTER — Other Ambulatory Visit: Payer: Self-pay

## 2020-06-18 ENCOUNTER — Ambulatory Visit
Admission: RE | Admit: 2020-06-18 | Discharge: 2020-06-18 | Disposition: A | Discharge status: Home / Self Care | Payer: Medicare PPO | Source: Ambulatory Visit | Attending: Family Medicine | Admitting: Family Medicine

## 2020-06-18 DIAGNOSIS — Z1231 Encounter for screening mammogram for malignant neoplasm of breast: Secondary | ICD-10-CM | POA: Diagnosis not present

## 2020-06-21 DIAGNOSIS — Z20822 Contact with and (suspected) exposure to covid-19: Secondary | ICD-10-CM | POA: Diagnosis not present

## 2020-07-15 DIAGNOSIS — H90A32 Mixed conductive and sensorineural hearing loss, unilateral, left ear with restricted hearing on the contralateral side: Secondary | ICD-10-CM | POA: Diagnosis not present

## 2020-07-15 DIAGNOSIS — H906 Mixed conductive and sensorineural hearing loss, bilateral: Secondary | ICD-10-CM | POA: Diagnosis not present

## 2020-08-05 DIAGNOSIS — S0502XS Injury of conjunctiva and corneal abrasion without foreign body, left eye, sequela: Secondary | ICD-10-CM | POA: Diagnosis not present

## 2020-08-05 DIAGNOSIS — H35372 Puckering of macula, left eye: Secondary | ICD-10-CM | POA: Diagnosis not present

## 2020-08-05 DIAGNOSIS — H179 Unspecified corneal scar and opacity: Secondary | ICD-10-CM | POA: Diagnosis not present

## 2020-08-05 DIAGNOSIS — H524 Presbyopia: Secondary | ICD-10-CM | POA: Diagnosis not present

## 2020-08-05 DIAGNOSIS — H10413 Chronic giant papillary conjunctivitis, bilateral: Secondary | ICD-10-CM | POA: Diagnosis not present

## 2020-08-05 DIAGNOSIS — H5203 Hypermetropia, bilateral: Secondary | ICD-10-CM | POA: Diagnosis not present

## 2020-08-05 DIAGNOSIS — H43393 Other vitreous opacities, bilateral: Secondary | ICD-10-CM | POA: Diagnosis not present

## 2020-08-05 DIAGNOSIS — H15111 Episcleritis periodica fugax, right eye: Secondary | ICD-10-CM | POA: Diagnosis not present

## 2020-08-05 DIAGNOSIS — H2513 Age-related nuclear cataract, bilateral: Secondary | ICD-10-CM | POA: Diagnosis not present

## 2020-08-12 DIAGNOSIS — Z20822 Contact with and (suspected) exposure to covid-19: Secondary | ICD-10-CM | POA: Diagnosis not present

## 2020-09-06 DIAGNOSIS — Z20822 Contact with and (suspected) exposure to covid-19: Secondary | ICD-10-CM | POA: Diagnosis not present

## 2020-09-10 DIAGNOSIS — Z20822 Contact with and (suspected) exposure to covid-19: Secondary | ICD-10-CM | POA: Diagnosis not present

## 2020-09-12 DIAGNOSIS — U071 COVID-19: Secondary | ICD-10-CM | POA: Diagnosis not present

## 2020-09-30 DIAGNOSIS — R300 Dysuria: Secondary | ICD-10-CM | POA: Diagnosis not present

## 2020-09-30 DIAGNOSIS — N3090 Cystitis, unspecified without hematuria: Secondary | ICD-10-CM | POA: Diagnosis not present

## 2020-10-15 DIAGNOSIS — M170 Bilateral primary osteoarthritis of knee: Secondary | ICD-10-CM | POA: Diagnosis not present

## 2020-10-21 DIAGNOSIS — H903 Sensorineural hearing loss, bilateral: Secondary | ICD-10-CM | POA: Diagnosis not present

## 2020-10-21 DIAGNOSIS — N3941 Urge incontinence: Secondary | ICD-10-CM | POA: Diagnosis not present

## 2020-10-21 DIAGNOSIS — N3281 Overactive bladder: Secondary | ICD-10-CM | POA: Diagnosis not present

## 2020-10-21 DIAGNOSIS — F419 Anxiety disorder, unspecified: Secondary | ICD-10-CM | POA: Diagnosis not present

## 2020-10-21 DIAGNOSIS — N183 Chronic kidney disease, stage 3 unspecified: Secondary | ICD-10-CM | POA: Diagnosis not present

## 2020-10-21 DIAGNOSIS — H90A32 Mixed conductive and sensorineural hearing loss, unilateral, left ear with restricted hearing on the contralateral side: Secondary | ICD-10-CM | POA: Diagnosis not present

## 2020-10-21 DIAGNOSIS — N3010 Interstitial cystitis (chronic) without hematuria: Secondary | ICD-10-CM | POA: Diagnosis not present

## 2020-10-21 DIAGNOSIS — F4321 Adjustment disorder with depressed mood: Secondary | ICD-10-CM | POA: Diagnosis not present

## 2020-10-22 DIAGNOSIS — M170 Bilateral primary osteoarthritis of knee: Secondary | ICD-10-CM | POA: Diagnosis not present

## 2020-10-29 DIAGNOSIS — M170 Bilateral primary osteoarthritis of knee: Secondary | ICD-10-CM | POA: Diagnosis not present

## 2020-12-13 DIAGNOSIS — Z20822 Contact with and (suspected) exposure to covid-19: Secondary | ICD-10-CM | POA: Diagnosis not present

## 2020-12-24 DIAGNOSIS — H6982 Other specified disorders of Eustachian tube, left ear: Secondary | ICD-10-CM | POA: Diagnosis not present

## 2020-12-24 DIAGNOSIS — W57XXXA Bitten or stung by nonvenomous insect and other nonvenomous arthropods, initial encounter: Secondary | ICD-10-CM | POA: Diagnosis not present

## 2020-12-24 DIAGNOSIS — S0006XA Insect bite (nonvenomous) of scalp, initial encounter: Secondary | ICD-10-CM | POA: Diagnosis not present

## 2020-12-24 DIAGNOSIS — Z7184 Encounter for health counseling related to travel: Secondary | ICD-10-CM | POA: Diagnosis not present

## 2020-12-24 DIAGNOSIS — J302 Other seasonal allergic rhinitis: Secondary | ICD-10-CM | POA: Diagnosis not present

## 2021-01-10 DIAGNOSIS — M1712 Unilateral primary osteoarthritis, left knee: Secondary | ICD-10-CM | POA: Diagnosis not present

## 2021-01-30 DIAGNOSIS — J029 Acute pharyngitis, unspecified: Secondary | ICD-10-CM | POA: Diagnosis not present

## 2021-01-30 DIAGNOSIS — R5383 Other fatigue: Secondary | ICD-10-CM | POA: Diagnosis not present

## 2021-01-30 DIAGNOSIS — Z20822 Contact with and (suspected) exposure to covid-19: Secondary | ICD-10-CM | POA: Diagnosis not present

## 2021-03-22 DIAGNOSIS — Z20822 Contact with and (suspected) exposure to covid-19: Secondary | ICD-10-CM | POA: Diagnosis not present

## 2021-04-25 DIAGNOSIS — N952 Postmenopausal atrophic vaginitis: Secondary | ICD-10-CM | POA: Diagnosis not present

## 2021-04-25 DIAGNOSIS — J301 Allergic rhinitis due to pollen: Secondary | ICD-10-CM | POA: Diagnosis not present

## 2021-04-25 DIAGNOSIS — M8588 Other specified disorders of bone density and structure, other site: Secondary | ICD-10-CM | POA: Diagnosis not present

## 2021-04-25 DIAGNOSIS — N3010 Interstitial cystitis (chronic) without hematuria: Secondary | ICD-10-CM | POA: Diagnosis not present

## 2021-04-25 DIAGNOSIS — A6000 Herpesviral infection of urogenital system, unspecified: Secondary | ICD-10-CM | POA: Diagnosis not present

## 2021-04-25 DIAGNOSIS — E785 Hyperlipidemia, unspecified: Secondary | ICD-10-CM | POA: Diagnosis not present

## 2021-04-25 DIAGNOSIS — Z23 Encounter for immunization: Secondary | ICD-10-CM | POA: Diagnosis not present

## 2021-04-25 DIAGNOSIS — Z0000 Encounter for general adult medical examination without abnormal findings: Secondary | ICD-10-CM | POA: Diagnosis not present

## 2021-04-25 DIAGNOSIS — M170 Bilateral primary osteoarthritis of knee: Secondary | ICD-10-CM | POA: Diagnosis not present

## 2021-04-28 ENCOUNTER — Other Ambulatory Visit: Payer: Self-pay | Admitting: Family Medicine

## 2021-04-28 DIAGNOSIS — M8580 Other specified disorders of bone density and structure, unspecified site: Secondary | ICD-10-CM

## 2021-05-06 ENCOUNTER — Other Ambulatory Visit: Payer: Self-pay | Admitting: Family Medicine

## 2021-05-06 DIAGNOSIS — Z1231 Encounter for screening mammogram for malignant neoplasm of breast: Secondary | ICD-10-CM

## 2021-05-12 DIAGNOSIS — N8184 Pelvic muscle wasting: Secondary | ICD-10-CM | POA: Diagnosis not present

## 2021-05-12 DIAGNOSIS — R6884 Jaw pain: Secondary | ICD-10-CM | POA: Diagnosis not present

## 2021-05-12 DIAGNOSIS — M170 Bilateral primary osteoarthritis of knee: Secondary | ICD-10-CM | POA: Diagnosis not present

## 2021-05-12 DIAGNOSIS — N3010 Interstitial cystitis (chronic) without hematuria: Secondary | ICD-10-CM | POA: Diagnosis not present

## 2021-05-12 DIAGNOSIS — M6281 Muscle weakness (generalized): Secondary | ICD-10-CM | POA: Diagnosis not present

## 2021-05-12 DIAGNOSIS — R102 Pelvic and perineal pain: Secondary | ICD-10-CM | POA: Diagnosis not present

## 2021-05-12 DIAGNOSIS — K5900 Constipation, unspecified: Secondary | ICD-10-CM | POA: Diagnosis not present

## 2021-05-12 DIAGNOSIS — N3946 Mixed incontinence: Secondary | ICD-10-CM | POA: Diagnosis not present

## 2021-05-12 DIAGNOSIS — M2669 Other specified disorders of temporomandibular joint: Secondary | ICD-10-CM | POA: Diagnosis not present

## 2021-05-14 DIAGNOSIS — M6281 Muscle weakness (generalized): Secondary | ICD-10-CM | POA: Diagnosis not present

## 2021-05-14 DIAGNOSIS — N3946 Mixed incontinence: Secondary | ICD-10-CM | POA: Diagnosis not present

## 2021-05-14 DIAGNOSIS — M2669 Other specified disorders of temporomandibular joint: Secondary | ICD-10-CM | POA: Diagnosis not present

## 2021-05-14 DIAGNOSIS — R102 Pelvic and perineal pain: Secondary | ICD-10-CM | POA: Diagnosis not present

## 2021-05-14 DIAGNOSIS — N3010 Interstitial cystitis (chronic) without hematuria: Secondary | ICD-10-CM | POA: Diagnosis not present

## 2021-05-14 DIAGNOSIS — K5900 Constipation, unspecified: Secondary | ICD-10-CM | POA: Diagnosis not present

## 2021-05-14 DIAGNOSIS — N8184 Pelvic muscle wasting: Secondary | ICD-10-CM | POA: Diagnosis not present

## 2021-05-14 DIAGNOSIS — R6884 Jaw pain: Secondary | ICD-10-CM | POA: Diagnosis not present

## 2021-05-26 DIAGNOSIS — M170 Bilateral primary osteoarthritis of knee: Secondary | ICD-10-CM | POA: Diagnosis not present

## 2021-05-28 DIAGNOSIS — R102 Pelvic and perineal pain: Secondary | ICD-10-CM | POA: Diagnosis not present

## 2021-05-28 DIAGNOSIS — K5900 Constipation, unspecified: Secondary | ICD-10-CM | POA: Diagnosis not present

## 2021-05-28 DIAGNOSIS — N3946 Mixed incontinence: Secondary | ICD-10-CM | POA: Diagnosis not present

## 2021-05-28 DIAGNOSIS — M2669 Other specified disorders of temporomandibular joint: Secondary | ICD-10-CM | POA: Diagnosis not present

## 2021-05-28 DIAGNOSIS — M6281 Muscle weakness (generalized): Secondary | ICD-10-CM | POA: Diagnosis not present

## 2021-05-28 DIAGNOSIS — R6884 Jaw pain: Secondary | ICD-10-CM | POA: Diagnosis not present

## 2021-05-28 DIAGNOSIS — N3010 Interstitial cystitis (chronic) without hematuria: Secondary | ICD-10-CM | POA: Diagnosis not present

## 2021-05-28 DIAGNOSIS — N8184 Pelvic muscle wasting: Secondary | ICD-10-CM | POA: Diagnosis not present

## 2021-05-29 DIAGNOSIS — N3946 Mixed incontinence: Secondary | ICD-10-CM | POA: Diagnosis not present

## 2021-05-29 DIAGNOSIS — R6884 Jaw pain: Secondary | ICD-10-CM | POA: Diagnosis not present

## 2021-05-29 DIAGNOSIS — M6281 Muscle weakness (generalized): Secondary | ICD-10-CM | POA: Diagnosis not present

## 2021-05-29 DIAGNOSIS — M2669 Other specified disorders of temporomandibular joint: Secondary | ICD-10-CM | POA: Diagnosis not present

## 2021-05-29 DIAGNOSIS — N3010 Interstitial cystitis (chronic) without hematuria: Secondary | ICD-10-CM | POA: Diagnosis not present

## 2021-05-29 DIAGNOSIS — K5900 Constipation, unspecified: Secondary | ICD-10-CM | POA: Diagnosis not present

## 2021-05-29 DIAGNOSIS — N8184 Pelvic muscle wasting: Secondary | ICD-10-CM | POA: Diagnosis not present

## 2021-05-29 DIAGNOSIS — R102 Pelvic and perineal pain: Secondary | ICD-10-CM | POA: Diagnosis not present

## 2021-06-02 DIAGNOSIS — M170 Bilateral primary osteoarthritis of knee: Secondary | ICD-10-CM | POA: Diagnosis not present

## 2021-06-03 DIAGNOSIS — R102 Pelvic and perineal pain: Secondary | ICD-10-CM | POA: Diagnosis not present

## 2021-06-03 DIAGNOSIS — N3946 Mixed incontinence: Secondary | ICD-10-CM | POA: Diagnosis not present

## 2021-06-03 DIAGNOSIS — K5900 Constipation, unspecified: Secondary | ICD-10-CM | POA: Diagnosis not present

## 2021-06-03 DIAGNOSIS — M2669 Other specified disorders of temporomandibular joint: Secondary | ICD-10-CM | POA: Diagnosis not present

## 2021-06-03 DIAGNOSIS — R6884 Jaw pain: Secondary | ICD-10-CM | POA: Diagnosis not present

## 2021-06-03 DIAGNOSIS — N8184 Pelvic muscle wasting: Secondary | ICD-10-CM | POA: Diagnosis not present

## 2021-06-03 DIAGNOSIS — N3010 Interstitial cystitis (chronic) without hematuria: Secondary | ICD-10-CM | POA: Diagnosis not present

## 2021-06-03 DIAGNOSIS — M6281 Muscle weakness (generalized): Secondary | ICD-10-CM | POA: Diagnosis not present

## 2021-06-09 DIAGNOSIS — M170 Bilateral primary osteoarthritis of knee: Secondary | ICD-10-CM | POA: Diagnosis not present

## 2021-06-11 DIAGNOSIS — N3010 Interstitial cystitis (chronic) without hematuria: Secondary | ICD-10-CM | POA: Diagnosis not present

## 2021-06-11 DIAGNOSIS — M2669 Other specified disorders of temporomandibular joint: Secondary | ICD-10-CM | POA: Diagnosis not present

## 2021-06-11 DIAGNOSIS — R6884 Jaw pain: Secondary | ICD-10-CM | POA: Diagnosis not present

## 2021-06-11 DIAGNOSIS — K5900 Constipation, unspecified: Secondary | ICD-10-CM | POA: Diagnosis not present

## 2021-06-11 DIAGNOSIS — N3946 Mixed incontinence: Secondary | ICD-10-CM | POA: Diagnosis not present

## 2021-06-11 DIAGNOSIS — R102 Pelvic and perineal pain: Secondary | ICD-10-CM | POA: Diagnosis not present

## 2021-06-11 DIAGNOSIS — N8184 Pelvic muscle wasting: Secondary | ICD-10-CM | POA: Diagnosis not present

## 2021-06-11 DIAGNOSIS — M6281 Muscle weakness (generalized): Secondary | ICD-10-CM | POA: Diagnosis not present

## 2021-06-11 DIAGNOSIS — B349 Viral infection, unspecified: Secondary | ICD-10-CM | POA: Diagnosis not present

## 2021-06-13 DIAGNOSIS — M2669 Other specified disorders of temporomandibular joint: Secondary | ICD-10-CM | POA: Diagnosis not present

## 2021-06-13 DIAGNOSIS — K5900 Constipation, unspecified: Secondary | ICD-10-CM | POA: Diagnosis not present

## 2021-06-13 DIAGNOSIS — R102 Pelvic and perineal pain: Secondary | ICD-10-CM | POA: Diagnosis not present

## 2021-06-13 DIAGNOSIS — M6281 Muscle weakness (generalized): Secondary | ICD-10-CM | POA: Diagnosis not present

## 2021-06-13 DIAGNOSIS — N3010 Interstitial cystitis (chronic) without hematuria: Secondary | ICD-10-CM | POA: Diagnosis not present

## 2021-06-13 DIAGNOSIS — R6884 Jaw pain: Secondary | ICD-10-CM | POA: Diagnosis not present

## 2021-06-13 DIAGNOSIS — N8184 Pelvic muscle wasting: Secondary | ICD-10-CM | POA: Diagnosis not present

## 2021-06-13 DIAGNOSIS — N3946 Mixed incontinence: Secondary | ICD-10-CM | POA: Diagnosis not present

## 2021-06-18 DIAGNOSIS — N3946 Mixed incontinence: Secondary | ICD-10-CM | POA: Diagnosis not present

## 2021-06-18 DIAGNOSIS — N8184 Pelvic muscle wasting: Secondary | ICD-10-CM | POA: Diagnosis not present

## 2021-06-18 DIAGNOSIS — R6884 Jaw pain: Secondary | ICD-10-CM | POA: Diagnosis not present

## 2021-06-18 DIAGNOSIS — M2669 Other specified disorders of temporomandibular joint: Secondary | ICD-10-CM | POA: Diagnosis not present

## 2021-06-18 DIAGNOSIS — M6281 Muscle weakness (generalized): Secondary | ICD-10-CM | POA: Diagnosis not present

## 2021-06-18 DIAGNOSIS — K5900 Constipation, unspecified: Secondary | ICD-10-CM | POA: Diagnosis not present

## 2021-06-18 DIAGNOSIS — N3010 Interstitial cystitis (chronic) without hematuria: Secondary | ICD-10-CM | POA: Diagnosis not present

## 2021-06-18 DIAGNOSIS — R102 Pelvic and perineal pain: Secondary | ICD-10-CM | POA: Diagnosis not present

## 2021-06-19 ENCOUNTER — Ambulatory Visit
Admission: RE | Admit: 2021-06-19 | Discharge: 2021-06-19 | Disposition: A | Discharge status: Home / Self Care | Payer: Medicare PPO | Source: Ambulatory Visit | Attending: Family Medicine | Admitting: Family Medicine

## 2021-06-19 ENCOUNTER — Other Ambulatory Visit: Payer: Self-pay

## 2021-06-19 DIAGNOSIS — K5900 Constipation, unspecified: Secondary | ICD-10-CM | POA: Diagnosis not present

## 2021-06-19 DIAGNOSIS — N3010 Interstitial cystitis (chronic) without hematuria: Secondary | ICD-10-CM | POA: Diagnosis not present

## 2021-06-19 DIAGNOSIS — R6884 Jaw pain: Secondary | ICD-10-CM | POA: Diagnosis not present

## 2021-06-19 DIAGNOSIS — N8184 Pelvic muscle wasting: Secondary | ICD-10-CM | POA: Diagnosis not present

## 2021-06-19 DIAGNOSIS — M6281 Muscle weakness (generalized): Secondary | ICD-10-CM | POA: Diagnosis not present

## 2021-06-19 DIAGNOSIS — R102 Pelvic and perineal pain: Secondary | ICD-10-CM | POA: Diagnosis not present

## 2021-06-19 DIAGNOSIS — N3946 Mixed incontinence: Secondary | ICD-10-CM | POA: Diagnosis not present

## 2021-06-19 DIAGNOSIS — Z1231 Encounter for screening mammogram for malignant neoplasm of breast: Secondary | ICD-10-CM | POA: Diagnosis not present

## 2021-06-25 DIAGNOSIS — R6884 Jaw pain: Secondary | ICD-10-CM | POA: Diagnosis not present

## 2021-06-25 DIAGNOSIS — N8184 Pelvic muscle wasting: Secondary | ICD-10-CM | POA: Diagnosis not present

## 2021-06-25 DIAGNOSIS — N3010 Interstitial cystitis (chronic) without hematuria: Secondary | ICD-10-CM | POA: Diagnosis not present

## 2021-06-25 DIAGNOSIS — K5900 Constipation, unspecified: Secondary | ICD-10-CM | POA: Diagnosis not present

## 2021-06-25 DIAGNOSIS — M6281 Muscle weakness (generalized): Secondary | ICD-10-CM | POA: Diagnosis not present

## 2021-06-25 DIAGNOSIS — N3946 Mixed incontinence: Secondary | ICD-10-CM | POA: Diagnosis not present

## 2021-06-25 DIAGNOSIS — R102 Pelvic and perineal pain: Secondary | ICD-10-CM | POA: Diagnosis not present

## 2021-07-16 DIAGNOSIS — E785 Hyperlipidemia, unspecified: Secondary | ICD-10-CM | POA: Diagnosis not present

## 2021-08-11 DIAGNOSIS — M170 Bilateral primary osteoarthritis of knee: Secondary | ICD-10-CM | POA: Diagnosis not present

## 2021-08-18 DIAGNOSIS — F4320 Adjustment disorder, unspecified: Secondary | ICD-10-CM | POA: Diagnosis not present

## 2021-08-21 DIAGNOSIS — E785 Hyperlipidemia, unspecified: Secondary | ICD-10-CM | POA: Diagnosis not present

## 2021-08-25 DIAGNOSIS — M170 Bilateral primary osteoarthritis of knee: Secondary | ICD-10-CM | POA: Diagnosis not present

## 2021-08-27 DIAGNOSIS — H35372 Puckering of macula, left eye: Secondary | ICD-10-CM | POA: Diagnosis not present

## 2021-08-27 DIAGNOSIS — H179 Unspecified corneal scar and opacity: Secondary | ICD-10-CM | POA: Diagnosis not present

## 2021-08-27 DIAGNOSIS — H43393 Other vitreous opacities, bilateral: Secondary | ICD-10-CM | POA: Diagnosis not present

## 2021-08-27 DIAGNOSIS — H10413 Chronic giant papillary conjunctivitis, bilateral: Secondary | ICD-10-CM | POA: Diagnosis not present

## 2021-08-27 DIAGNOSIS — H15111 Episcleritis periodica fugax, right eye: Secondary | ICD-10-CM | POA: Diagnosis not present

## 2021-08-27 DIAGNOSIS — H2513 Age-related nuclear cataract, bilateral: Secondary | ICD-10-CM | POA: Diagnosis not present

## 2021-08-28 DIAGNOSIS — M25562 Pain in left knee: Secondary | ICD-10-CM | POA: Diagnosis not present

## 2021-08-28 DIAGNOSIS — Z791 Long term (current) use of non-steroidal anti-inflammatories (NSAID): Secondary | ICD-10-CM | POA: Diagnosis not present

## 2021-08-28 DIAGNOSIS — N183 Chronic kidney disease, stage 3 unspecified: Secondary | ICD-10-CM | POA: Diagnosis not present

## 2021-08-28 DIAGNOSIS — E785 Hyperlipidemia, unspecified: Secondary | ICD-10-CM | POA: Diagnosis not present

## 2021-08-28 DIAGNOSIS — F4320 Adjustment disorder, unspecified: Secondary | ICD-10-CM | POA: Diagnosis not present

## 2021-08-28 DIAGNOSIS — M25561 Pain in right knee: Secondary | ICD-10-CM | POA: Diagnosis not present

## 2021-09-04 DIAGNOSIS — M25561 Pain in right knee: Secondary | ICD-10-CM | POA: Diagnosis not present

## 2021-09-11 DIAGNOSIS — F4320 Adjustment disorder, unspecified: Secondary | ICD-10-CM | POA: Diagnosis not present

## 2021-09-18 ENCOUNTER — Ambulatory Visit: Payer: Medicare PPO | Admitting: Internal Medicine

## 2021-09-19 ENCOUNTER — Encounter: Payer: Self-pay | Admitting: Cardiology

## 2021-09-19 ENCOUNTER — Other Ambulatory Visit: Payer: Self-pay

## 2021-09-19 ENCOUNTER — Ambulatory Visit: Payer: Medicare PPO | Admitting: Cardiology

## 2021-09-19 VITALS — BP 98/60 | HR 74 | Ht 60.0 in | Wt 129.8 lb

## 2021-09-19 DIAGNOSIS — R0789 Other chest pain: Secondary | ICD-10-CM | POA: Diagnosis not present

## 2021-09-19 DIAGNOSIS — E785 Hyperlipidemia, unspecified: Secondary | ICD-10-CM | POA: Insufficient documentation

## 2021-09-19 DIAGNOSIS — I200 Unstable angina: Secondary | ICD-10-CM

## 2021-09-19 LAB — BASIC METABOLIC PANEL
BUN/Creatinine Ratio: 11 — ABNORMAL LOW (ref 12–28)
BUN: 10 mg/dL (ref 8–27)
CO2: 25 mmol/L (ref 20–29)
Calcium: 9.5 mg/dL (ref 8.7–10.3)
Chloride: 102 mmol/L (ref 96–106)
Creatinine, Ser: 0.9 mg/dL (ref 0.57–1.00)
Glucose: 74 mg/dL (ref 70–99)
Potassium: 4.2 mmol/L (ref 3.5–5.2)
Sodium: 139 mmol/L (ref 134–144)
eGFR: 68 mL/min/{1.73_m2} (ref >59)

## 2021-09-19 MED ORDER — METOPROLOL TARTRATE 50 MG PO TABS: ORAL_TABLET | ORAL | 0 refills | Status: DC

## 2021-09-19 NOTE — Patient Instructions (Signed)
Medication Instructions:  Instcu  *If you need a refill on your cardiac medications before your next appointment, please call your pharmacy*   Lab Work: TODAY:  BMET  If you have labs (blood work) drawn today and your tests are completely normal, you will receive your results only by: MyChart Message (if you have MyChart) OR A paper copy in the mail If you have any lab test that is abnormal or we need to change your treatment, we will call you to review the results.   Testing/Procedures: Your physician has requested that you have an echocardiogram. Echocardiography is a painless test that uses sound waves to create images of your heart. It provides your doctor with information about the size and shape of your heart and how well your hearts chambers and valves are working. This procedure takes approximately one hour. There are no restrictions for this procedure.  Your physician has requested that you have cardiac CT. Cardiac computed tomography (CT) is a painless test that uses an x-ray machine to take clear, detailed pictures of your heart. For further information please visit [email or web address] Please follow instruction sheet BELOW:    Your cardiac CT will be scheduled at one of the below locations:   [facility] [address] [place], [place] [address] [phone]  Please arrive at the [facility] main entrance (entrance A) of [facility] 30 minutes prior to test start time. You can use the FREE valet parking offered at the main entrance (encouraged to control the heart rate for the test) Proceed to the [facility] Radiology Department (first floor) to check-in and test prep.  Please follow these instructions carefully (unless otherwise directed):  On the Night Before the Test: Be sure to Drink plenty of water. Do not consume any caffeinated/decaffeinated beverages or chocolate 12 hours prior to your test. Do not take any antihistamines 12 hours prior  to your test.  On the Day of the Test: Drink plenty of water until 1 hour prior to the test. Do not eat any food 4 hours prior to the test. You may take your regular medications prior to the test.  Take metoprolol (Lopressor) 50 mg two hours prior to test. This has been sent to [facility]- please wear underwire-free bra if available, avoid dresses & tight clothing       After the Test: Drink plenty of water. After receiving IV contrast, you may experience a mild flushed feeling. This is normal. On occasion, you may experience a mild rash up to 24 hours after the test. This is not dangerous. If this occurs, you can take Benadryl 25 mg and increase your fluid intake. If you experience trouble breathing, this can be serious. If it is severe call 911 IMMEDIATELY. If it is mild, please call our office.   We will call to schedule your test 2-4 weeks out understanding that some insurance companies will need an authorization prior to the service being performed.   For non-scheduling related questions, please contact the cardiac imaging nurse navigator should you have any questions/concerns: Rockwell Alexandria, Cardiac Imaging Nurse Navigator Larey Brick, Cardiac Imaging Nurse Navigator [facility] Direct Office Dial: [phone]   For scheduling needs, including cancellations and rescheduling, please call [place], [phone].      Follow-Up: At [facility], you and your health needs are our priority.  As part of our continuing mission to provide you with exceptional heart care, we have created designated Provider Care Teams.  These Care Teams include your primary Cardiologist (physician) and Advanced Practice Providers (APPs -  Physician Assistants and Nurse Practitioners) who all work together to provide you with the care you need, when you need it.  We recommend signing up for the patient portal called "MyChart".  Sign up information is provided on this  After Visit Summary.  MyChart is used to connect with patients for Virtual Visits (Telemedicine).  Patients are able to view lab/test results, encounter notes, upcoming appointments, etc.  Non-urgent messages can be sent to your provider as well.   To learn more about what you can do with MyChart, go to [email or web address].    Your next appointment:   AS NEEDED  The format for your next appointment:     Provider:   Armanda Magic, MD     Other Instructions

## 2021-09-19 NOTE — Progress Notes (Signed)
Cardiology CONSULT Note    Date:  09/19/2021   ID:  Antrice, Hoch September 09, 1950, MRN [id number]  PCP:  Laurann Montana, MD  Cardiologist:  Armanda Magic, MD   Chief Complaint  Patient presents with   New Patient (Initial Visit)    Hyperlipidemia    History of Present Illness:  Angel Warren is a 71 y.o. female who is being seen today for the evaluation of hyperlipidemia at the request of Laurann Montana, MD.  This is a 71 year old female with a history of anxiety and depression, hyperlipidemia, osteopenia, CKD stage III who was referred for further evaluation of hyperlipidemia per request of patient.  She is also had some episodes of atypical chest discomfort in the past.  She is doing well today.  She tells me that she sporadically has some tingling in her left shoulder and goes down the left arm.  She has one pin point spot to the left of her sternum that is a uncomfortable that she thinks is MSK and will be there for hours at a time and then go away.  There is no associated sx of nausea, diaphoresis or SOB or radiation.  She denies any chest pain or pressure, shortness of breath (except with hiking), PND, orthopnea, dizziness or syncope.  She has LE edema at time but is related to knee issues.   She has been maintained on atorvastatin 20 mg daily and her last labs dated 1/2 96 and HDL 85.  01/2022 showed a triglyceride 96 HDL 85 LDL 96 serum creatinine 0.89 and potassium 4.5 with normal LFTs.  She has a family history of hypertension and hyperlipidemia.  She hikes, bikes and teaches yoga for exercise.  Past Medical History:  Diagnosis Date   Allergic rhinitis    Anxiety    Atrophic vaginitis    Chronic kidney disease (CKD), stage III (moderate) (HCC)    COVID    Genital herpes simplex    Grief    Hyperlipidemia    Interstitial cystitis    Osteoarthritis of both knees    Overactive bladder    Seasonal allergies    Tinnitus of both ears    Urge incontinence     Vaginal dryness, menopausal     No past surgical history on file.  Current Medications: Current Meds  Medication Sig   alendronate (FOSAMAX) 70 MG tablet Take 70 mg by mouth once a week. Take with a full glass of water on an empty stomach.   atorvastatin (LIPITOR) 20 MG tablet Take 20 mg by mouth daily.   calcium carbonate (OS-CAL) 600 MG TABS tablet Take 600 mg by mouth 2 (two) times daily with a meal.   cetirizine (ZYRTEC) 10 MG tablet Take 10 mg by mouth daily.   ESTRADIOL PO Place 1 drop vaginally at bedtime.   fluticasone (FLONASE) 50 MCG/ACT nasal spray Place 2 sprays into both nostrils 3 times/day as needed-between meals & bedtime for allergies or rhinitis.   Glucosamine-Chondroitin (GLUCOSAMINE CHONDR COMPLEX PO) Take by mouth.   OVER THE COUNTER MEDICATION Take by mouth in the morning and at bedtime. BLADDER BUILDER   psyllium (METAMUCIL SMOOTH TEXTURE) 28 % packet Take 1 packet by mouth 2 (two) times daily.   Triamcinolone Acetonide (NASACORT AQ NA) Place into the nose.   TURMERIC PO Take by mouth daily.   valACYclovir (VALTREX) 500 MG tablet Take 500 mg by mouth as needed (HERPES).   VITAMIN D PO Take 25 mcg by mouth.  Allergies:   Augmentin [amoxicillin-pot clavulanate], Cefdinir, Celecoxib, Levaquin [levofloxacin], Myrbetriq [mirabegron], Penicillins, Sulfamethoxazole, and Yuvafem [estradiol]   Social History   Socioeconomic History   Marital status: Divorced    Spouse name: Not on file   Number of children: Not on file   Years of education: Not on file   Highest education level: Not on file  Occupational History   Not on file  Tobacco Use   Smoking status: Never   Smokeless tobacco: Never  Substance and Sexual Activity   Alcohol use: Yes    Comment: OCCASIONALLY   Drug use: Not on file   Sexual activity: Not on file  Other Topics Concern   Not on file  Social History Narrative   Not on file   Social Determinants of Health   Financial Resource Strain:  Not on file  Food Insecurity: Not on file  Transportation Needs: Not on file  Physical Activity: Not on file  Stress: Not on file  Social Connections: Not on file     Family History:  The patient's family history includes Asthma in her maternal grandmother; Cancer in her father and mother; Heart attack in her maternal grandfather; Heart disease in her paternal grandmother; Hypertension in her brother and father.   ROS:   Please see the history of present illness.    ROS All other systems reviewed and are negative.  No flowsheet data found.     PHYSICAL EXAM:   VS:  BP 98/60    Pulse 74    Ht 5' (1.524 m)    Wt 129 lb 12.8 oz (58.9 kg)    SpO2 99%    BMI 25.35 kg/m    GEN: Well nourished, well developed, in no acute distress  HEENT: normal  Neck: no JVD, carotid bruits, or masses Cardiac: RRR; no murmurs, rubs, or gallops,no edema.  Intact distal pulses bilaterally.  Respiratory:  clear to auscultation bilaterally, normal work of breathing GI: soft, nontender, nondistended, + BS MS: no deformity or atrophy  Skin: warm and dry, no rash Neuro:  Alert and Oriented x 3, Strength and sensation are intact Psych: euthymic mood, full affect  Wt Readings from Last 3 Encounters:  09/19/21 129 lb 12.8 oz (58.9 kg)  04/01/17 125 lb (56.7 kg)  03/08/17 125 lb (56.7 kg)      Studies/Labs Reviewed:   EKG:  EKG is ordered today.  The ekg ordered today demonstrates NSR with no ST changes  Recent Labs: No results found for requested labs within last 8760 hours.   Lipid Panel No results found for: CHOL, TRIG, HDL, CHOLHDL, VLDL, LDLCALC, LDLDIRECT   Additional studies/ records that were reviewed today include:  Office visit notes and labs from PCP    ASSESSMENT:    1. Hyperlipidemia LDL goal <100   2. Atypical chest pain      PLAN:  In order of problems listed above:  Hyperlipidemia -LDL goal is less than 100 -Her last LDL was 96 and HDL 85. -I have recommended that  she continue on atorvastatin 20 mg daily  2.  Atypical chest pain -her CP is very pinpoint and suspect it is not related to her heart but given her cardiac risk factors of HLD and fm hx of CAD and her concern that she may have underlying CAD, I have recommended proceeding with coronary CTA to define coronary anatomy -check 2D echo to assess LVF   Time Spent: 20 minutes total time of encounter, including 15 minutes  spent in face-to-face patient care on the date of this encounter. This time includes coordination of care and counseling regarding above mentioned problem list. Remainder of non-face-to-face time involved reviewing chart documents/testing relevant to the patient encounter and documentation in the medical record. I have independently reviewed documentation from referring provider  Medication Adjustments/Labs and Tests Ordered: Current medicines are reviewed at length with the patient today.  Concerns regarding medicines are outlined above.  Medication changes, Labs and Tests ordered today are listed in the Patient Instructions below.  There are no Patient Instructions on file for this visit.   Signed, Armanda Magic, MD  09/19/2021 9:16 AM    [facility] [address] [place], [place], [place]  [address] Phone: [phone]; Fax: [phone]

## 2021-09-19 NOTE — Addendum Note (Signed)
Addended by: Burnetta Sabin on: 09/19/2021 09:33 AM   Modules accepted: Orders

## 2021-10-07 ENCOUNTER — Other Ambulatory Visit: Payer: Self-pay

## 2021-10-07 ENCOUNTER — Ambulatory Visit (HOSPITAL_COMMUNITY): Payer: Medicare PPO | Attending: Cardiovascular Disease

## 2021-10-07 DIAGNOSIS — R079 Chest pain, unspecified: Secondary | ICD-10-CM | POA: Diagnosis not present

## 2021-10-07 DIAGNOSIS — I200 Unstable angina: Secondary | ICD-10-CM | POA: Insufficient documentation

## 2021-10-07 LAB — ECHOCARDIOGRAM COMPLETE
Area-P 1/2: 2.94 cm2
S' Lateral: 2.2 cm

## 2021-10-08 ENCOUNTER — Encounter: Payer: Self-pay | Admitting: Cardiology

## 2021-10-08 ENCOUNTER — Telehealth (HOSPITAL_COMMUNITY): Payer: Self-pay | Admitting: Emergency Medicine

## 2021-10-08 ENCOUNTER — Encounter (HOSPITAL_COMMUNITY): Payer: Self-pay

## 2021-10-08 ENCOUNTER — Other Ambulatory Visit (HOSPITAL_COMMUNITY): Payer: Self-pay

## 2021-10-08 ENCOUNTER — Telehealth: Payer: Self-pay

## 2021-10-08 DIAGNOSIS — I341 Nonrheumatic mitral (valve) prolapse: Secondary | ICD-10-CM

## 2021-10-08 DIAGNOSIS — R0789 Other chest pain: Secondary | ICD-10-CM

## 2021-10-08 MED ORDER — IVABRADINE HCL 5 MG PO TABS
10.0000 mg | ORAL_TABLET | Freq: Once | ORAL | 0 refills | Status: AC
  Filled 2021-10-08: qty 2, 1d supply, fill #0

## 2021-10-08 NOTE — Telephone Encounter (Signed)
Reaching out to patient to offer assistance regarding upcoming cardiac imaging study; pt verbalizes understanding of appt date/time, parking situation and where to check in, pre-test NPO status and medications ordered, and verified current allergies; name and call back number provided for further questions should they arise ?Angel Alexandria RN Navigator Cardiac Imaging ?[facility] ?[phone] office ?[phone] cell ? ?Changing pre med from metop to 10mg  ivabradine due to low BP. ?Pt agreed to pick up from [facility] ?Denies iv issues ?Arrival 830 ?

## 2021-10-08 NOTE — Telephone Encounter (Signed)
-----   Message from Quintella Reichert, MD sent at 10/08/2021  8:35 AM EST ----- ?2D echo showed normal heart function with mildly thickened heart muscle, mitral valve prolapse with mildly leaky MV>>repeat echo in 1 year ?

## 2021-10-08 NOTE — Telephone Encounter (Signed)
The patient has been notified of the result and verbalized understanding.  All questions (if any) were answered. ?Antonieta Iba, RN 10/08/2021 1:25 PM  ? ?

## 2021-10-10 ENCOUNTER — Telehealth: Payer: Self-pay

## 2021-10-10 ENCOUNTER — Other Ambulatory Visit: Payer: Self-pay

## 2021-10-10 ENCOUNTER — Ambulatory Visit (HOSPITAL_COMMUNITY)
Admission: RE | Admit: 2021-10-10 | Discharge: 2021-10-10 | Disposition: A | Discharge status: Home / Self Care | Payer: Medicare PPO | Source: Ambulatory Visit | Attending: Cardiology | Admitting: Cardiology

## 2021-10-10 ENCOUNTER — Encounter: Payer: Self-pay | Admitting: Cardiology

## 2021-10-10 DIAGNOSIS — I200 Unstable angina: Secondary | ICD-10-CM | POA: Insufficient documentation

## 2021-10-10 DIAGNOSIS — E785 Hyperlipidemia, unspecified: Secondary | ICD-10-CM

## 2021-10-10 DIAGNOSIS — I2510 Atherosclerotic heart disease of native coronary artery without angina pectoris: Secondary | ICD-10-CM | POA: Insufficient documentation

## 2021-10-10 DIAGNOSIS — I700 Atherosclerosis of aorta: Secondary | ICD-10-CM | POA: Diagnosis not present

## 2021-10-10 MED ORDER — ASPIRIN EC 81 MG PO TBEC: 81.0000 mg | DELAYED_RELEASE_TABLET | Freq: Every day | ORAL | 3 refills | Status: DC

## 2021-10-10 MED ORDER — IOHEXOL 350 MG/ML SOLN
100.0000 mL | Freq: Once | INTRAVENOUS | Status: AC | PRN
  Administered 2021-10-10: 100 mL via INTRAVENOUS

## 2021-10-10 MED ORDER — NITROGLYCERIN 0.4 MG SL SUBL
0.8000 mg | SUBLINGUAL_TABLET | Freq: Once | SUBLINGUAL | Status: AC
  Administered 2021-10-10: 0.8 mg via SUBLINGUAL

## 2021-10-10 MED ORDER — NITROGLYCERIN 0.4 MG SL SUBL
SUBLINGUAL_TABLET | SUBLINGUAL | Status: AC
  Filled 2021-10-10: qty 2

## 2021-10-10 NOTE — Telephone Encounter (Signed)
-----   Message from Angel Margarita, MD sent at 10/10/2021 11:47 AM EST ----- ?Coronary CTA showed an elevated coronary calcium score of 119 with mild nonobstructive calcified plaque in the proximal LAD with 25 to 49% stenosis.  There is some calcification of the aortic valve.  Recommend starting aspirin 81 mg daily.  Please have her come in for a fasting lipid panel and ALT ?

## 2021-10-10 NOTE — Telephone Encounter (Signed)
The patient has been notified of the result and verbalized understanding.  All questions (if any) were answered. ?Antonieta Iba, RN 10/10/2021 4:36 PM  ?Patient will get lab work done at her PCP.  ? ?

## 2021-10-14 ENCOUNTER — Ambulatory Visit
Admission: RE | Admit: 2021-10-14 | Discharge: 2021-10-14 | Disposition: A | Discharge status: Home / Self Care | Payer: Medicare PPO | Source: Ambulatory Visit | Attending: Family Medicine | Admitting: Family Medicine

## 2021-10-14 DIAGNOSIS — M8589 Other specified disorders of bone density and structure, multiple sites: Secondary | ICD-10-CM | POA: Diagnosis not present

## 2021-10-14 DIAGNOSIS — Z780 Asymptomatic menopausal state: Secondary | ICD-10-CM | POA: Diagnosis not present

## 2021-10-14 DIAGNOSIS — M8580 Other specified disorders of bone density and structure, unspecified site: Secondary | ICD-10-CM

## 2021-10-20 DIAGNOSIS — M1711 Unilateral primary osteoarthritis, right knee: Secondary | ICD-10-CM | POA: Diagnosis not present

## 2021-10-20 DIAGNOSIS — E785 Hyperlipidemia, unspecified: Secondary | ICD-10-CM | POA: Diagnosis not present

## 2021-10-20 DIAGNOSIS — M8588 Other specified disorders of bone density and structure, other site: Secondary | ICD-10-CM | POA: Diagnosis not present

## 2021-10-20 DIAGNOSIS — I2510 Atherosclerotic heart disease of native coronary artery without angina pectoris: Secondary | ICD-10-CM | POA: Diagnosis not present

## 2021-10-20 DIAGNOSIS — I340 Nonrheumatic mitral (valve) insufficiency: Secondary | ICD-10-CM | POA: Diagnosis not present

## 2021-10-24 ENCOUNTER — Telehealth: Payer: Self-pay | Admitting: Cardiology

## 2021-10-24 DIAGNOSIS — E785 Hyperlipidemia, unspecified: Secondary | ICD-10-CM

## 2021-10-24 NOTE — Telephone Encounter (Signed)
Patient called stating doctor increased her statin on Monday evening she was on 20mg  to 40mg  on 3/20, she has increased body aches, joint pain, muscle ache.  She is not going to do any exercise today to rule that out. She said she was going to go back to the 20mg , she is thinking it's too much for her body.  ? ?Pt c/o medication issue: ? ?1. Name of Medication: atorvastatin (LIPITOR) 40 MG tablet ? ?2. How are you currently taking this medication (dosage and times per day)? Take 20 mg by mouth daily. ? ?3. Are you having a reaction (difficulty breathing--STAT)?  ? ?4. What is your medication issue? Patient called stating her statin was increased this week from 20mg  to 40mg  on 3/20, she has increased body aches, joint pain, and muscle ache.  She is not going to do any exercise today to rule that out. She said she was going to go back to the 20mg , she is thinking it's too much for her body.  She wants to know if the doctor wants to try a different medication or leave her on the 20mg  of this medication and see if that will lower her LDL.  ?

## 2021-10-24 NOTE — Telephone Encounter (Signed)
Spoke with the patient and advised her to go back to the 20 mg daily until she sees lipid clinic. Patient verbalized understanding.  ?

## 2021-10-24 NOTE — Telephone Encounter (Signed)
Spoke with the patient and advised that Dr. Mayford Knife has referred her to lipid clinic. Patient verbalized understanding.  ?

## 2021-10-24 NOTE — Telephone Encounter (Signed)
Patient is scheduled for the lipid clinic on 04/18. Patient is wanting to know what to do in the mean time in regards to her medications.  ?

## 2021-11-03 ENCOUNTER — Telehealth: Payer: Self-pay

## 2021-11-03 DIAGNOSIS — M25562 Pain in left knee: Secondary | ICD-10-CM | POA: Diagnosis not present

## 2021-11-03 DIAGNOSIS — M25561 Pain in right knee: Secondary | ICD-10-CM | POA: Diagnosis not present

## 2021-11-03 NOTE — Telephone Encounter (Signed)
The patient has been notified of the result and verbalized understanding.  All questions (if any) were answered. ?Theresia Majors, RN 11/03/2021 1:54 PM  ?Patient was not able to tolerate atorvastatin 40 mg. Her PCP has switched her to rosuvastatin 5 mg three times per week. She has an appointment with lipid clinic on 4/18.  ?

## 2021-11-03 NOTE — Telephone Encounter (Signed)
-----   Message from Quintella Reichert, MD sent at 11/03/2021  1:18 PM EDT ----- ?LDL goal is less than 70.  Please have her increase atorvastatin to 40 mg daily and repeat FLP and ALT in 6 weeks ?

## 2021-11-18 ENCOUNTER — Ambulatory Visit: Payer: Medicare PPO

## 2021-11-18 DIAGNOSIS — E785 Hyperlipidemia, unspecified: Secondary | ICD-10-CM

## 2021-11-18 DIAGNOSIS — M25562 Pain in left knee: Secondary | ICD-10-CM | POA: Diagnosis not present

## 2021-11-18 DIAGNOSIS — M25561 Pain in right knee: Secondary | ICD-10-CM | POA: Diagnosis not present

## 2021-11-18 DIAGNOSIS — I700 Atherosclerosis of aorta: Secondary | ICD-10-CM | POA: Diagnosis not present

## 2021-11-18 NOTE — Patient Instructions (Addendum)
Try one dose of Repatha. Inject into fatty tissue of your stomach, at least 1 inch away from your belly button. Read the instructions or look up a video online to help with administration.  ? ?PCSK9i are injections. Cost would be $40/month or $80/3 months. These are the most effective at lowering LDL, anywhere from 40-60% and are given as injections into stomach or thigh every 2 weeks. ?Your insurance prefers Repatha. ? ?Other oral options include Zetia and Nexlitol that would be a tablet once daily. ?Zetia would be $10/month ?Nexlitol would be $40/month ?

## 2021-11-18 NOTE — Progress Notes (Addendum)
Patient ID: Angel Warren                 DOB: 07/04/1951                    MRN: [id number]     HPI: Angel Warren is a 71 y.o. female patient referred to lipid clinic by Dr. Mayford Knife. PMH is significant for HLD, CKD stage III, anxiety, depression, and osteopenia. CAC 119 with nonobstructive CAD to LAD with some calcification on the aortic valve on March 2023 coronary CTA. Patient started on aspirin 81 mg daily.   Patient started on atorvastatin 20 mg in December 2022 by Dr. Cliffton Asters, her PCP. LDL above goal in March and Dr. Mayford Knife increased atorvastatin to 40 mg daily. Shortly after, patient experienced muscle aches and joint pain. Dr Mayford Knife instructed patient to take 20 mg daily until her lipid clinic visit in April. Dr. Cliffton Asters switched the atorvastatin to rosuvastatin 5 mg three times weekly in the interim.  Presents to PharmD lipid clinic today. She reports not taking any statin since April 11th and no longer has muscle aches. Developed a stiff neck/shoulders on atorvastatin 20 mg. She reports not being happy but took it anyway. Muscle aches in neck/shoulders worsened when dose was increased to 40 mg daily and she got teary saying it was the worst pain she's experienced and doesn't want to try it again. Says that movement helped but the pain worsened with time. She was off the atorvastatin 40 mg for about a week and a half before starting rosuvastatin 5 mg three times weekly. Only tolerated 4 doses before stopping due to the pain. 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 Activity is limited some by her knee issues. Patient is adamant about not re-trying another statin.  Current Medications: none - self discontinued rosuvastatin 5 mg three times weekly in mid-April Intolerances: Atorvastatin 20-40 mg (body/muscle aches, joint pain), rosuvastatin 5 mg three times  weekly Risk Factors: elevated calcium score LDL goal: <70 mg/dL  Diet: Mediterranean diet, avoids caffeine & citric acid. 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  Family History: Heart attack - maternal grandfather; Heart disease - paternal grandmother; HTN - brother and father; Asthma - material grandmother; Cancer - father and mother  Social History: No reported tobacco, alcohol or illicit drug use  Labs: 10/20/2021: LDL-C 95, TC 195, HDL 80, TG 116, non-HDL 116 08/28/2021: LCL-C 96, TC 198, HDL 85, TG 96, non-HDL 113 Baseline LDL-C~ 153  Past Medical History:  Diagnosis Date   Allergic rhinitis    Anxiety    Aortic atherosclerosis (HCC)    Atrophic vaginitis    CAD (coronary artery disease), native coronary artery    Coronary CTA showed an elevated coronary calcium score of 119 with mild nonobstructive calcified plaque in the proximal LAD with 25 to 49% stenosis by CTA 10/2021   Chronic kidney disease (CKD), stage III (moderate) (HCC)    COVID    Genital herpes simplex    Grief    Hyperlipidemia    Interstitial cystitis    Mitral valve prolapse    bileaflet with mild MR by echo 10/2021   Osteoarthritis of both knees    Overactive bladder    Seasonal allergies    Tinnitus of both ears  Urge incontinence    Vaginal dryness, menopausal     Current Outpatient Medications on File Prior to Visit  Medication Sig Dispense Refill   alendronate (FOSAMAX) 70 MG tablet Take 70 mg by mouth once a week. Take with a full glass of water on an empty stomach.     aspirin EC 81 MG tablet Take 1 tablet (81 mg total) by mouth daily. Swallow whole. 90 tablet 3   atorvastatin (LIPITOR) 20 MG tablet Take 20 mg by mouth daily. (Patient not taking: Reported on 11/03/2021)     calcium carbonate (OS-CAL) 600 MG TABS tablet Take 600 mg by mouth 2 (two) times daily with a meal.     cetirizine (ZYRTEC) 10 MG tablet Take  10 mg by mouth daily.     ESTRADIOL PO Place 1 drop vaginally at bedtime.     fluticasone (FLONASE) 50 MCG/ACT nasal spray Place 2 sprays into both nostrils 3 times/day as needed-between meals & bedtime for allergies or rhinitis.     Glucosamine-Chondroitin (GLUCOSAMINE CHONDR COMPLEX PO) Take by mouth.     metoprolol tartrate (LOPRESSOR) 50 MG tablet TAKE 1 TABLET BY MOUTH 2 HOURS PRIOR TO YOUR CARDIAC CT 1 tablet 0   OVER THE COUNTER MEDICATION Take by mouth in the morning and at bedtime. BLADDER BUILDER     psyllium (METAMUCIL SMOOTH TEXTURE) 28 % packet Take 1 packet by mouth 2 (two) times daily.     rosuvastatin (CRESTOR) 5 MG tablet Take 5 mg by mouth 3 (three) times a week.     Triamcinolone Acetonide (NASACORT AQ NA) Place into the nose.     TURMERIC PO Take by mouth daily.     valACYclovir (VALTREX) 500 MG tablet Take 500 mg by mouth as needed (HERPES).     VITAMIN D PO Take 25 mcg by mouth.     No current facility-administered medications on file prior to visit.    Allergies  Allergen Reactions   Augmentin [Amoxicillin-Pot Clavulanate] Hives   Cefdinir    Celecoxib Hives   Levaquin [Levofloxacin]     DIZZINESS    Myrbetriq [Mirabegron]     EDEMA   Penicillins    Sulfamethoxazole Hives   Yuvafem [Estradiol]     Assessment/Plan:  1. Hyperlipidemia -  LDL is above goal of < 70 mg/dL. Of note, there wasn't a full two week statin holiday after stopping atorvastatin and before starting her rosuvastatin. Patient's main goal is to lower her cholesterol without pain or significant impact on her quality of life. Patient refuses to retry any other statin and alternative options were discussed at length. Patient brought in her own research on potential lipid lowering medications. Provided clinical data and cost information for oral options with Zetia and Nexlitol but patient was concerned that she'd have to take one or both daily and not be able to lower her LDL enough. Reviewed  PCSK9i, the injection technique, and data behind its LDL lowering of up to 60%, but patient didn't like the idea of injecting herself. She did like that it was only twice a month and was OK with the $40 copay/month (or $80/3 months). Explained lab markers that help inform lipid management including ApoB, Lp(a), and the significance of LDL particles. After reviewing medication options multiple times, patient decided to try a sample of Repatha and go home and think over her options. Educated on injection site reactions with PCSK9i and instructed patient to call back with final thoughts before moving forward. Provided her  with Malena PeerMelissa Maccia, PharmD's phone number.  Thank you,  Filbert SchilderEmma Ousmane Seeman, PharmD PGY1 Pharmacy Resident [facility]  [address], [place], [place] [id number]

## 2021-11-21 ENCOUNTER — Telehealth: Payer: Self-pay

## 2021-11-21 DIAGNOSIS — E785 Hyperlipidemia, unspecified: Secondary | ICD-10-CM

## 2021-11-21 NOTE — Telephone Encounter (Signed)
Spoke with pt, she hasn't started on Repatha yet. Says that she's had a busy week with family visiting and wanted to focus on being able to recognize the side effects. She looked up possible side effects and wanted to go through each one. She say that people can get high sugars and develop diabetes from Repatha. We discussed the low incidence of this occurring with her healthy lifestyle and that her glucose on previous labs are within the normal range (87 on 10/13/21 and 95 on 10/20/21). Reinforced this multiple times as it kept getting brought up in the conversation. She was also worried about it impacting her kidneys and liver. Reviewed that there aren't any reported negative effects on those organs. Patient was also concerned about myalgias with history of intolerance to statins. Discussed that this has a low incidence in 4% of patients. Reviewed administration technique three times with patient over the phone. Patient was still worried about doing the wrong thing. She wanted to talk through it as if she was giving the medication herself at that time. We did this twice. Instructed patient to try medication on Monday or Tuesday and call us with her decision to start the medication.  ? ?Will need to schedule another visit if she has additional questions as this phone call took 28 minutes.  ?

## 2021-11-21 NOTE — Telephone Encounter (Signed)
Called patient to assess tolerability of Repatha injections. No answer, left voice message. ?

## 2021-11-24 DIAGNOSIS — M170 Bilateral primary osteoarthritis of knee: Secondary | ICD-10-CM | POA: Diagnosis not present

## 2021-11-25 DIAGNOSIS — M1711 Unilateral primary osteoarthritis, right knee: Secondary | ICD-10-CM | POA: Diagnosis not present

## 2021-11-25 DIAGNOSIS — M1712 Unilateral primary osteoarthritis, left knee: Secondary | ICD-10-CM | POA: Diagnosis not present

## 2021-11-25 MED ORDER — REPATHA SURECLICK 140 MG/ML ~~LOC~~ SOAJ: 140.0000 mg | SUBCUTANEOUS | 11 refills | Status: DC

## 2021-11-25 NOTE — Telephone Encounter (Signed)
Patient called to report that she tolerated her sample Repatha dose and wishes to continue. PA submitted for Repatha. Instructed patient that we will call back when the PA returns and schedule repeat lipid panel in 2-3 months. Will send prescription to preferred pharmacy of [facility] on [address] when approved.  ?

## 2021-11-25 NOTE — Telephone Encounter (Signed)
PA approved through 05/24/2022. Prescription for Repatha sent to pharmacy - copay $40. Patient notified of changes and requested a 90 day supply. Called pharmacy to change prescription for 90 day supply. Scheduled repeat lipid panel for June. ?

## 2021-11-28 ENCOUNTER — Telehealth: Payer: Self-pay | Admitting: Cardiology

## 2021-11-28 NOTE — Telephone Encounter (Signed)
? ?  Pt c/o medication issue: ? ?1. Name of Medication:  ? Evolocumab (REPATHA SURECLICK) 140 MG/ML SOAJ  ? ? ?2. How are you currently taking this medication (dosage and times per day)? Inject 140 mg into the skin every 14 (fourteen) days. ? ?3. Are you having a reaction (difficulty breathing--STAT)?  ? ?4. What is your medication issue? Pt requesting to speak with PharmD Kara Mead regarding this meds ?

## 2021-12-03 DIAGNOSIS — M170 Bilateral primary osteoarthritis of knee: Secondary | ICD-10-CM | POA: Diagnosis not present

## 2021-12-04 NOTE — Telephone Encounter (Signed)
Spoke with patient. She reports some cramps in her calves and toes. She is in PT and had some dry needling done recently. ?Wanted to report it. Not sure if its from Repatha and wants to know if it will go away. ?I recommended rolling her calves with a foam roller or lacrosse ball and rolling her feet with a firm ball. Patient will continue Repatha. ?

## 2021-12-05 DIAGNOSIS — M170 Bilateral primary osteoarthritis of knee: Secondary | ICD-10-CM | POA: Diagnosis not present

## 2021-12-08 DIAGNOSIS — M170 Bilateral primary osteoarthritis of knee: Secondary | ICD-10-CM | POA: Diagnosis not present

## 2021-12-10 DIAGNOSIS — M170 Bilateral primary osteoarthritis of knee: Secondary | ICD-10-CM | POA: Diagnosis not present

## 2021-12-15 DIAGNOSIS — M170 Bilateral primary osteoarthritis of knee: Secondary | ICD-10-CM | POA: Diagnosis not present

## 2021-12-17 DIAGNOSIS — M170 Bilateral primary osteoarthritis of knee: Secondary | ICD-10-CM | POA: Diagnosis not present

## 2021-12-24 DIAGNOSIS — M170 Bilateral primary osteoarthritis of knee: Secondary | ICD-10-CM | POA: Diagnosis not present

## 2021-12-26 DIAGNOSIS — G4719 Other hypersomnia: Secondary | ICD-10-CM | POA: Diagnosis not present

## 2021-12-26 DIAGNOSIS — E785 Hyperlipidemia, unspecified: Secondary | ICD-10-CM | POA: Diagnosis not present

## 2022-01-05 ENCOUNTER — Telehealth: Payer: Self-pay | Admitting: Cardiology

## 2022-01-05 DIAGNOSIS — M170 Bilateral primary osteoarthritis of knee: Secondary | ICD-10-CM | POA: Diagnosis not present

## 2022-01-05 NOTE — Telephone Encounter (Signed)
Pt c/o medication issue:  1. Name of Medication: Evolocumab (REPATHA SURECLICK) 140 MG/ML SOAJ  2. How are you currently taking this medication (dosage and times per day)? As directed   3. Are you having a reaction (difficulty breathing--STAT)? Extreme fatigue  4. What is your medication issue? Patient said she started the Repatha in May and has extreme fatigue, to the point where all she wants to do is lay down. She wanted to know if it was normal

## 2022-01-05 NOTE — Telephone Encounter (Signed)
Repatha does not typically cause fatigue and should not be causing extreme fatigue. Reported rates of fatigue in clinical trials were 1% in the placebo group vs 1.6% in the Repatha group so there is a small change it could be contributing.   She also is no longer on atorvastatin or rosuvastatin due to intolerances but they were still listed on her med list, I have removed these.  Spoke with pt. She states her eyes feel tired and puffy and has overall drowsiness and wants to nap. Gave her 4th dose this AM. Moved up efficacy labs to tomorrow from the end of the month. Advised pt to skip her next dose of Repatha due on 6/19 to see if her fatigue improves, then resume with her next scheduled dose and let us know how she feels. Can try Nexlizet if needed in the future (same pricing as her Repatha, Praluent is more expensive).

## 2022-01-06 ENCOUNTER — Other Ambulatory Visit: Payer: Medicare PPO

## 2022-01-06 DIAGNOSIS — E785 Hyperlipidemia, unspecified: Secondary | ICD-10-CM

## 2022-01-07 LAB — LIPID PANEL
Chol/HDL Ratio: 2.4 ratio (ref 0.0–4.4)
Cholesterol, Total: 185 mg/dL (ref 100–199)
HDL: 76 mg/dL (ref >39)
LDL Chol Calc (NIH): 90 mg/dL (ref 0–99)
Triglycerides: 109 mg/dL (ref 0–149)
VLDL Cholesterol Cal: 19 mg/dL (ref 5–40)

## 2022-01-07 LAB — ALT: ALT: 13 IU/L (ref 0–32)

## 2022-01-07 LAB — APOLIPOPROTEIN B: Apolipoprotein B: 81 mg/dL (ref <90)

## 2022-01-08 DIAGNOSIS — M170 Bilateral primary osteoarthritis of knee: Secondary | ICD-10-CM | POA: Diagnosis not present

## 2022-01-08 NOTE — Telephone Encounter (Signed)
Updated labs show LDL at 90 after giving 4 injections of Repatha, LDL above goal < 70. Intolerant to multiple statins. Pt already planning to skip next Repatha injection, then resume, and provide [place] with update on fatigue. If she does well with Repatha rechallenge, will need to add on Zetia to bring LDL to goal. If she does not, will pursue Nexlizet. Lab results released to pt in MyChart.

## 2022-01-13 DIAGNOSIS — G4733 Obstructive sleep apnea (adult) (pediatric): Secondary | ICD-10-CM | POA: Diagnosis not present

## 2022-01-16 DIAGNOSIS — R5383 Other fatigue: Secondary | ICD-10-CM | POA: Diagnosis not present

## 2022-01-16 DIAGNOSIS — G4719 Other hypersomnia: Secondary | ICD-10-CM | POA: Diagnosis not present

## 2022-01-16 DIAGNOSIS — M170 Bilateral primary osteoarthritis of knee: Secondary | ICD-10-CM | POA: Diagnosis not present

## 2022-01-16 DIAGNOSIS — M8588 Other specified disorders of bone density and structure, other site: Secondary | ICD-10-CM | POA: Diagnosis not present

## 2022-01-16 DIAGNOSIS — E785 Hyperlipidemia, unspecified: Secondary | ICD-10-CM | POA: Diagnosis not present

## 2022-01-16 DIAGNOSIS — E28319 Asymptomatic premature menopause: Secondary | ICD-10-CM | POA: Diagnosis not present

## 2022-01-21 DIAGNOSIS — M170 Bilateral primary osteoarthritis of knee: Secondary | ICD-10-CM | POA: Diagnosis not present

## 2022-01-28 ENCOUNTER — Other Ambulatory Visit: Payer: Medicare PPO

## 2022-01-29 ENCOUNTER — Encounter: Payer: Self-pay | Admitting: Cardiology

## 2022-01-29 DIAGNOSIS — I200 Unstable angina: Secondary | ICD-10-CM

## 2022-01-29 DIAGNOSIS — I700 Atherosclerosis of aorta: Secondary | ICD-10-CM

## 2022-01-29 DIAGNOSIS — I341 Nonrheumatic mitral (valve) prolapse: Secondary | ICD-10-CM

## 2022-01-30 MED ORDER — ASPIRIN 81 MG PO TBEC: 81.0000 mg | DELAYED_RELEASE_TABLET | ORAL | Status: AC

## 2022-02-02 ENCOUNTER — Other Ambulatory Visit: Payer: Medicare PPO

## 2022-02-02 DIAGNOSIS — I700 Atherosclerosis of aorta: Secondary | ICD-10-CM

## 2022-02-02 DIAGNOSIS — I341 Nonrheumatic mitral (valve) prolapse: Secondary | ICD-10-CM

## 2022-02-02 DIAGNOSIS — I200 Unstable angina: Secondary | ICD-10-CM | POA: Diagnosis not present

## 2022-02-02 LAB — CBC WITH DIFF/PLATELET
Basophils Absolute: 0 10*3/uL (ref 0.0–0.2)
Basos: 1 %
EOS (ABSOLUTE): 0.2 10*3/uL (ref 0.0–0.4)
Eos: 4 %
Hematocrit: 40.6 % (ref 34.0–46.6)
Hemoglobin: 13.7 g/dL (ref 11.1–15.9)
Lymphocytes Absolute: 2.1 10*3/uL (ref 0.7–3.1)
Lymphs: 43 %
MCH: 31.5 pg (ref 26.6–33.0)
MCHC: 33.7 g/dL (ref 31.5–35.7)
MCV: 93 fL (ref 79–97)
Monocytes Absolute: 0.4 10*3/uL (ref 0.1–0.9)
Monocytes: 8 %
Neutrophils Absolute: 2.1 10*3/uL (ref 1.4–7.0)
Neutrophils: 44 %
Platelets: 225 10*3/uL (ref 150–450)
RBC: 4.35 x10E6/uL (ref 3.77–5.28)
RDW: 13.5 % (ref 11.7–15.4)
WBC: 4.8 10*3/uL (ref 3.4–10.8)

## 2022-02-04 NOTE — Telephone Encounter (Signed)
I called pt and reviewed labs. Patient does not want to start ezetimibe now. Advised it is ok to continue Repatha and recheck labs in Sept. She held Repatha for 2 weeks. Doesn't think fatigue is from Repatha.

## 2022-02-04 NOTE — Telephone Encounter (Signed)
ApoB is close to goal of <80, however the lower we can get it the better. Called pt per request. LVM for her to call back to discuss side effects of ezetimibe.

## 2022-02-18 DIAGNOSIS — H6992 Unspecified Eustachian tube disorder, left ear: Secondary | ICD-10-CM | POA: Diagnosis not present

## 2022-03-05 DIAGNOSIS — R5383 Other fatigue: Secondary | ICD-10-CM | POA: Diagnosis not present

## 2022-03-05 DIAGNOSIS — N951 Menopausal and female climacteric states: Secondary | ICD-10-CM | POA: Diagnosis not present

## 2022-03-09 DIAGNOSIS — L821 Other seborrheic keratosis: Secondary | ICD-10-CM | POA: Diagnosis not present

## 2022-03-09 DIAGNOSIS — L814 Other melanin hyperpigmentation: Secondary | ICD-10-CM | POA: Diagnosis not present

## 2022-03-09 DIAGNOSIS — L723 Sebaceous cyst: Secondary | ICD-10-CM | POA: Diagnosis not present

## 2022-03-09 DIAGNOSIS — L578 Other skin changes due to chronic exposure to nonionizing radiation: Secondary | ICD-10-CM | POA: Diagnosis not present

## 2022-03-10 DIAGNOSIS — J0110 Acute frontal sinusitis, unspecified: Secondary | ICD-10-CM | POA: Diagnosis not present

## 2022-03-19 ENCOUNTER — Other Ambulatory Visit: Payer: Self-pay

## 2022-03-19 DIAGNOSIS — R058 Other specified cough: Secondary | ICD-10-CM | POA: Diagnosis not present

## 2022-03-26 DIAGNOSIS — M25562 Pain in left knee: Secondary | ICD-10-CM | POA: Diagnosis not present

## 2022-03-26 DIAGNOSIS — M25561 Pain in right knee: Secondary | ICD-10-CM | POA: Diagnosis not present

## 2022-03-26 DIAGNOSIS — M170 Bilateral primary osteoarthritis of knee: Secondary | ICD-10-CM | POA: Diagnosis not present

## 2022-03-26 DIAGNOSIS — M1711 Unilateral primary osteoarthritis, right knee: Secondary | ICD-10-CM | POA: Diagnosis not present

## 2022-03-26 DIAGNOSIS — M1712 Unilateral primary osteoarthritis, left knee: Secondary | ICD-10-CM | POA: Diagnosis not present

## 2022-04-03 DIAGNOSIS — M25562 Pain in left knee: Secondary | ICD-10-CM | POA: Diagnosis not present

## 2022-04-24 DIAGNOSIS — M25562 Pain in left knee: Secondary | ICD-10-CM | POA: Diagnosis not present

## 2022-04-24 DIAGNOSIS — M1712 Unilateral primary osteoarthritis, left knee: Secondary | ICD-10-CM | POA: Diagnosis not present

## 2022-05-03 DIAGNOSIS — R350 Frequency of micturition: Secondary | ICD-10-CM | POA: Diagnosis not present

## 2022-05-03 DIAGNOSIS — M62830 Muscle spasm of back: Secondary | ICD-10-CM | POA: Diagnosis not present

## 2022-05-03 DIAGNOSIS — N3010 Interstitial cystitis (chronic) without hematuria: Secondary | ICD-10-CM | POA: Diagnosis not present

## 2022-05-08 DIAGNOSIS — Z0000 Encounter for general adult medical examination without abnormal findings: Secondary | ICD-10-CM | POA: Diagnosis not present

## 2022-05-08 DIAGNOSIS — F419 Anxiety disorder, unspecified: Secondary | ICD-10-CM | POA: Diagnosis not present

## 2022-05-08 DIAGNOSIS — I2510 Atherosclerotic heart disease of native coronary artery without angina pectoris: Secondary | ICD-10-CM | POA: Diagnosis not present

## 2022-05-08 DIAGNOSIS — M170 Bilateral primary osteoarthritis of knee: Secondary | ICD-10-CM | POA: Diagnosis not present

## 2022-05-08 DIAGNOSIS — N3010 Interstitial cystitis (chronic) without hematuria: Secondary | ICD-10-CM | POA: Diagnosis not present

## 2022-05-08 DIAGNOSIS — I340 Nonrheumatic mitral (valve) insufficiency: Secondary | ICD-10-CM | POA: Diagnosis not present

## 2022-05-08 DIAGNOSIS — N3941 Urge incontinence: Secondary | ICD-10-CM | POA: Diagnosis not present

## 2022-05-08 DIAGNOSIS — E785 Hyperlipidemia, unspecified: Secondary | ICD-10-CM | POA: Diagnosis not present

## 2022-05-08 DIAGNOSIS — M8588 Other specified disorders of bone density and structure, other site: Secondary | ICD-10-CM | POA: Diagnosis not present

## 2022-05-08 DIAGNOSIS — E538 Deficiency of other specified B group vitamins: Secondary | ICD-10-CM | POA: Diagnosis not present

## 2022-05-18 ENCOUNTER — Telehealth: Payer: Self-pay | Admitting: Pharmacist

## 2022-05-18 DIAGNOSIS — E785 Hyperlipidemia, unspecified: Secondary | ICD-10-CM

## 2022-05-18 MED ORDER — EZETIMIBE 10 MG PO TABS: 10.0000 mg | ORAL_TABLET | Freq: Every day | ORAL | 3 refills | Status: DC

## 2022-05-18 NOTE — Telephone Encounter (Signed)
Patient called to report that her LDL-C with her PCP was 98. Reports PCP agrees that she should start Zetia. Rx sent to pharmacy. Medication reviewed. Labs scheduled for 08/10/22.  Pt supposed to have partial hip replacement. I advised that she have Dr. Milford Cage office fax over clearance request.

## 2022-05-22 ENCOUNTER — Other Ambulatory Visit: Payer: Self-pay | Admitting: Family Medicine

## 2022-05-22 DIAGNOSIS — Z1231 Encounter for screening mammogram for malignant neoplasm of breast: Secondary | ICD-10-CM

## 2022-06-08 ENCOUNTER — Telehealth: Payer: Self-pay | Admitting: Cardiology

## 2022-06-08 NOTE — Telephone Encounter (Signed)
Discussed with Marcelle Overlie [facility] CPP and this med (Zetia) can be the cause of these symptoms Pt will hold med and see it symptoms improve Pt to call with update ./cy

## 2022-06-08 NOTE — Telephone Encounter (Signed)
Pt c/o medication issue:  1. Name of Medication:   ezetimibe (ZETIA) 10 MG tablet    2. How are you currently taking this medication (dosage and times per day)? As prescribed   3. Are you having a reaction (difficulty breathing--STAT)? Yes  4. What is your medication issue? Pt states that her neck, across shoulders, upper back and hips feel stiff and sore. She believes it may be due to this medication and would like to discuss this. Please advise.

## 2022-06-16 ENCOUNTER — Telehealth: Payer: Self-pay | Admitting: *Deleted

## 2022-06-16 NOTE — Telephone Encounter (Signed)
   Pre-operative Risk Assessment    Patient Name: Angel Warren  DOB: [birth year] MRN: [id number]      Request for Surgical Clearance    Procedure:   LEFT PARTIAL KNEE ARTHROPLASTY  Date of Surgery:  Clearance 09/15/22                                 Surgeon:  DR. Ollen Gross Surgeon's Group or Practice Name:  [facility] Phone number:  [phone] Fax number:  [phone] ATTN: Aida Raider   Type of Clearance Requested:   - Medical ; ASA    Type of Anesthesia:   CHOICE   Additional requests/questions:    Elpidio Anis   06/16/2022, 4:52 PM

## 2022-06-18 NOTE — Telephone Encounter (Signed)
   Name: Angel Warren  DOB: 25-Mar-1951  MRN: [id number]  Primary Cardiologist: Armanda Magic, MD   Preoperative team, please contact this patient and set up a phone call appointment on or after 07/16/2023 for further preoperative risk assessment. Please obtain consent and complete medication review. Thank you for your help.  I confirm that guidance regarding antiplatelet and oral anticoagulation therapy has been completed and, if necessary, noted below.   Per office protocol, if patient is without any new symptoms or concerns at the time of their virtual visit, she may hold Aspirin for 5-7 days prior to procedure. Please resume Asprin as soon as possible postprocedure, at the discretion of the surgeon.    Joylene Grapes, NP 06/18/2022, 4:29 PM [facility]

## 2022-06-22 ENCOUNTER — Telehealth: Payer: Self-pay | Admitting: *Deleted

## 2022-06-22 NOTE — Telephone Encounter (Signed)
Pt scheduled for tele pre op appt 07/20/22 @ 3 pm. Med rec and consent are done.

## 2022-06-22 NOTE — Telephone Encounter (Signed)
Pt scheduled for tele pre op appt 07/20/22 @ 3 pm. Med rec and consent are done.     Patient Consent for Virtual Visit        Angel Warren has provided verbal consent on 06/22/2022 for a virtual visit (video or telephone).   CONSENT FOR VIRTUAL VISIT FOR:  Angel Warren  By participating in this virtual visit I agree to the following:  I hereby voluntarily request, consent and authorize [facility] and its employed or contracted physicians, physician assistants, nurse practitioners or other licensed health care professionals (the Practitioner), to provide me with telemedicine health care services (the "Services") as deemed necessary by the treating Practitioner. I acknowledge and consent to receive the Services by the Practitioner via telemedicine. I understand that the telemedicine visit will involve communicating with the Practitioner through live audiovisual communication technology and the disclosure of certain medical information by electronic transmission. I acknowledge that I have been given the opportunity to request an in-person assessment or other available alternative prior to the telemedicine visit and am voluntarily participating in the telemedicine visit.  I understand that I have the right to withhold or withdraw my consent to the use of telemedicine in the course of my care at any time, without affecting my right to future care or treatment, and that the Practitioner or I may terminate the telemedicine visit at any time. I understand that I have the right to inspect all information obtained and/or recorded in the course of the telemedicine visit and may receive copies of available information for a reasonable fee.  I understand that some of the potential risks of receiving the Services via telemedicine include:  Delay or interruption in medical evaluation due to technological equipment failure or disruption; Information transmitted may not be sufficient  (e.g. poor resolution of images) to allow for appropriate medical decision making by the Practitioner; and/or  In rare instances, security protocols could fail, causing a breach of personal health information.  Furthermore, I acknowledge that it is my responsibility to provide information about my medical history, conditions and care that is complete and accurate to the best of my ability. I acknowledge that Practitioner's advice, recommendations, and/or decision may be based on factors not within their control, such as incomplete or inaccurate data provided by me or distortions of diagnostic images or specimens that may result from electronic transmissions. I understand that the practice of medicine is not an exact science and that Practitioner makes no warranties or guarantees regarding treatment outcomes. I acknowledge that a copy of this consent can be made available to me via my patient portal [facility] MyChart), or I can request a printed copy by calling the office of  [facility].    I understand that my insurance will be billed for this visit.   I have read or had this consent read to me. I understand the contents of this consent, which adequately explains the benefits and risks of the Services being provided via telemedicine.  I have been provided ample opportunity to ask questions regarding this consent and the Services and have had my questions answered to my satisfaction. I give my informed consent for the services to be provided through the use of telemedicine in my medical care

## 2022-07-20 ENCOUNTER — Ambulatory Visit: Payer: Medicare PPO | Attending: Cardiology | Admitting: Student

## 2022-07-20 ENCOUNTER — Ambulatory Visit
Admission: RE | Admit: 2022-07-20 | Discharge: 2022-07-20 | Disposition: A | Discharge status: Home / Self Care | Payer: Medicare PPO | Source: Ambulatory Visit | Attending: Family Medicine | Admitting: Family Medicine

## 2022-07-20 DIAGNOSIS — Z01810 Encounter for preprocedural cardiovascular examination: Secondary | ICD-10-CM | POA: Diagnosis not present

## 2022-07-20 DIAGNOSIS — Z1231 Encounter for screening mammogram for malignant neoplasm of breast: Secondary | ICD-10-CM

## 2022-07-20 NOTE — Progress Notes (Signed)
Virtual Visit via Telephone Note   Because of Angel Warren's co-morbid illnesses, she is at least at moderate risk for complications without adequate follow up.  This format is felt to be most appropriate for this patient at this time.  The patient did not have access to video technology/had technical difficulties with video requiring transitioning to audio format only (telephone).  All issues noted in this document were discussed and addressed.  No physical exam could be performed with this format.  Please refer to the patient's chart for her consent to telehealth for [facility].  Evaluation Performed:  Preoperative Cardiovascular Risk Assessment _____________   Date:  07/20/2022   Patient ID:  Angel Warren, MRN [id number] Patient Location:  Home Provider location:   Office  Primary Care Provider:  Laurann Montana, MD Primary Cardiologist:  Armanda Magic, MD  Chief Complaint / Patient Profile   Angel Warren is a 71 y.o. year old female with a history of mild non-obstructive on coronary CTA in 10/2021, mitral valve prolapse, hyperlipidemia, CKD stage III, and interstitial cystitis who is pending a left partial knee arthroplasty on 09/15/2022 and presents today for telephonic preoperative cardiovascular risk assessment.  Past Medical History    Past Medical History:  Diagnosis Date   Allergic rhinitis    Anxiety    Aortic atherosclerosis (HCC)    Atrophic vaginitis    CAD (coronary artery disease), native coronary artery    Coronary CTA showed an elevated coronary calcium score of 119 with mild nonobstructive calcified plaque in the proximal LAD with 25 to 49% stenosis by CTA 10/2021   Chronic kidney disease (CKD), stage III (moderate) (HCC)    COVID    Genital herpes simplex    Grief    Hyperlipidemia    Interstitial cystitis    Mitral valve prolapse    bileaflet with mild MR by echo 10/2021   Osteoarthritis of both knees     Overactive bladder    Seasonal allergies    Tinnitus of both ears    Urge incontinence    Vaginal dryness, menopausal    No past surgical history on file.  Allergies  Allergies  Allergen Reactions   Augmentin [Amoxicillin-Pot Clavulanate] Hives   Cefdinir    Celecoxib Hives   Levaquin [Levofloxacin]     DIZZINESS    Myrbetriq [Mirabegron]     EDEMA   Penicillins    Sulfamethoxazole Hives   Yuvafem [Estradiol]     History of Present Illness    Angel Warren is a 71 y.o. female who presents via audio/video conferencing for a telehealth visit today.  Patient was last seen in our office on 09/19/2021 by Dr. Mayford Knife.  At that time, she reported very atypical chest pain. Coronary CTA was ordered and showed a coronary calcium score of 119 (72nd percentile for age and sex) with mild mixed non-obstructive CAD. Echo was also ordered and showed LVEF of of 60-65% with normal wall motion and diastolic parameters as well as mild late systolic prolapse of both mitral valve leaflets and mild MR.  She is now pending procedure as outlined above. Since her last visit, she is doing very well from a cardiac standpoint. She denies any chest pain, shortness of breath, orthopnea/ PND, palpitations, lightheadedness, dizziness, or syncope. She stays very active hiking, swimming, and going to the gym. She denies any chest pain or shortness of breath with these activities.  Home Medications    Prior to  Admission medications   Medication Sig Start Date End Date Taking? Authorizing Provider  alendronate (FOSAMAX) 70 MG tablet Take 70 mg by mouth once a week. Take with a full glass of water on an empty stomach.    [provider]  aspirin EC 81 MG tablet Take 1 tablet (81 mg total) by mouth every other day. Swallow whole. 01/30/22   Quintella Reichert, MD  calcium carbonate (OS-CAL) 600 MG TABS tablet Take 600 mg by mouth 2 (two) times daily with a meal.    [provider]  cetirizine  (ZYRTEC) 10 MG tablet Take 10 mg by mouth daily.    [provider]  ESTRADIOL PO Place 1 drop vaginally at bedtime.    [provider]  Evolocumab (REPATHA SURECLICK) 140 MG/ML SOAJ Inject 140 mg into the skin every 14 (fourteen) days. 11/25/21   Quintella Reichert, MD  ezetimibe (ZETIA) 10 MG tablet Take 1 tablet (10 mg total) by mouth daily. Patient not taking: Reported on 06/22/2022 05/18/22   Quintella Reichert, MD  fluticasone [facility]) 50 MCG/ACT nasal spray Place 2 sprays into both nostrils 3 times/day as needed-between meals & bedtime for allergies or rhinitis.    [provider]  Glucosamine-Chondroitin (GLUCOSAMINE CHONDR COMPLEX PO) Take by mouth.    [provider]  metoprolol tartrate (LOPRESSOR) 50 MG tablet TAKE 1 TABLET BY MOUTH 2 HOURS PRIOR TO YOUR CARDIAC CT Patient not taking: Reported on 06/22/2022 09/19/21   Quintella Reichert, MD  OVER THE COUNTER MEDICATION Take by mouth in the morning and at bedtime. BLADDER BUILDER    [provider]  psyllium (METAMUCIL SMOOTH TEXTURE) 28 % packet Take 1 packet by mouth 2 (two) times daily.    [provider]  Triamcinolone Acetonide (NASACORT AQ NA) Place into the nose.    [provider]  TURMERIC PO Take by mouth daily.    [provider]  valACYclovir (VALTREX) 500 MG tablet Take 500 mg by mouth as needed (HERPES).    [provider]  VITAMIN D PO Take 25 mcg by mouth.    [provider]    Physical Exam    Vital Signs:  Angel Warren does not have vital signs available for review today.  Given telephonic nature of communication, physical exam is limited. Alert and oriented x3. No acute distress. Normal affect. Speech and respirations are unlabored.  Accessory Clinical Findings    None  Assessment & Plan    Preoperative Cardiovascular Risk Assessment: Patient has an upcoming knee surgery planned. She is doing well from a cardiac  standpoint as described above. She is able to complete >4.0 METS without any anginal complaints. Per Revised Cardiac Risk Index Nedra Hai Criteria), considered low risk for adverse cardiac events perioperatively. Therefore, based on ACC/AHA guidelines, patient would be at acceptable risk for the planned procedure without further cardiovascular testing. OK to hold Aspirin for 5-7 days prior to procedure if needed but please restart as soon as safely possible afterwards. I will route this recommendation to the requesting party via Epic fax function.   A copy of this note will be routed to requesting surgeon.  Time:   Today, I have spent 12 minutes with the patient with telehealth technology discussing medical history, symptoms, and management plan.     Corrin Parker, PA-C  07/20/2022, 3:20 PM

## 2022-08-05 ENCOUNTER — Other Ambulatory Visit (HOSPITAL_COMMUNITY): Payer: Self-pay

## 2022-08-06 DIAGNOSIS — M1712 Unilateral primary osteoarthritis, left knee: Secondary | ICD-10-CM | POA: Diagnosis not present

## 2022-08-10 ENCOUNTER — Other Ambulatory Visit: Payer: Self-pay

## 2022-08-10 ENCOUNTER — Ambulatory Visit: Payer: Medicare PPO | Attending: Cardiology

## 2022-08-10 DIAGNOSIS — I341 Nonrheumatic mitral (valve) prolapse: Secondary | ICD-10-CM

## 2022-08-10 DIAGNOSIS — E785 Hyperlipidemia, unspecified: Secondary | ICD-10-CM | POA: Diagnosis not present

## 2022-08-12 LAB — LIPID PANEL
Chol/HDL Ratio: 1.9 ratio (ref 0.0–4.4)
Cholesterol, Total: 145 mg/dL (ref 100–199)
HDL: 76 mg/dL (ref >39)
LDL Chol Calc (NIH): 50 mg/dL (ref 0–99)
Triglycerides: 108 mg/dL (ref 0–149)
VLDL Cholesterol Cal: 19 mg/dL (ref 5–40)

## 2022-08-12 LAB — HEPATIC FUNCTION PANEL
ALT: 11 IU/L (ref 0–32)
AST: 20 IU/L (ref 0–40)
Albumin: 4 g/dL (ref 3.8–4.8)
Alkaline Phosphatase: 47 IU/L (ref 44–121)
Bilirubin Total: 0.4 mg/dL (ref 0.0–1.2)
Bilirubin, Direct: 0.13 mg/dL (ref 0.00–0.40)
Total Protein: 5.7 g/dL — ABNORMAL LOW (ref 6.0–8.5)

## 2022-08-12 LAB — APOLIPOPROTEIN B: Apolipoprotein B: 55 mg/dL (ref <90)

## 2022-08-24 DIAGNOSIS — Z0189 Encounter for other specified special examinations: Secondary | ICD-10-CM | POA: Diagnosis not present

## 2022-08-24 DIAGNOSIS — M1712 Unilateral primary osteoarthritis, left knee: Secondary | ICD-10-CM | POA: Diagnosis not present

## 2022-08-24 DIAGNOSIS — M25562 Pain in left knee: Secondary | ICD-10-CM | POA: Diagnosis not present

## 2022-08-26 ENCOUNTER — Ambulatory Visit: Payer: Medicare PPO | Admitting: Obstetrics and Gynecology

## 2022-08-26 ENCOUNTER — Encounter: Payer: Self-pay | Admitting: Obstetrics and Gynecology

## 2022-08-26 VITALS — BP 101/67 | HR 101 | Ht 59.0 in | Wt 131.0 lb

## 2022-08-26 DIAGNOSIS — R350 Frequency of micturition: Secondary | ICD-10-CM | POA: Diagnosis not present

## 2022-08-26 DIAGNOSIS — N3281 Overactive bladder: Secondary | ICD-10-CM

## 2022-08-26 DIAGNOSIS — R3989 Other symptoms and signs involving the genitourinary system: Secondary | ICD-10-CM

## 2022-08-26 DIAGNOSIS — M62838 Other muscle spasm: Secondary | ICD-10-CM | POA: Diagnosis not present

## 2022-08-26 LAB — POCT URINALYSIS DIPSTICK
Bilirubin, UA: NEGATIVE
Glucose, UA: NEGATIVE
Ketones, UA: NEGATIVE
Leukocytes, UA: NEGATIVE
Nitrite, UA: NEGATIVE
Protein, UA: NEGATIVE
Spec Grav, UA: 1.01 (ref 1.010–1.025)
Urobilinogen, UA: 0.2 E.U./dL
pH, UA: 7 (ref 5.0–8.0)

## 2022-08-26 MED ORDER — CYCLOBENZAPRINE HCL 5 MG PO TABS: 5.0000 mg | ORAL_TABLET | Freq: Three times a day (TID) | ORAL | 1 refills | Status: DC | PRN

## 2022-08-26 NOTE — Patient Instructions (Addendum)
Interstitial Cystitis/ Bladder Pain treatment:  Avoid irriative foods and beverages and identify other triggers Work to decrease stress- meditation, yoga Medications for bladder pain: pyridium (Azo), methenamine, Uribel Medications: amitiptyline, hydroxyzine ( or other anti-histamine), tums, L-arginine, Elmiron  Bladder instillations for pain flares Cystoscopy with hydrodistention  The IC Network at https://www.ic-network.com is helpful for bladder diet suggestions and forums for support.   We discussed the symptoms of overactive bladder (OAB), which include urinary urgency, urinary frequency, night-time urination, with or without urge incontinence.  We discussed management including behavioral therapy (decreasing bladder irritants by following a bladder diet, urge suppression strategies, timed voids, bladder retraining), physical therapy, medication; and for refractory cases posterior tibial nerve stimulation, sacral neuromodulation, and intravesical botulinum toxin injection.

## 2022-08-26 NOTE — Progress Notes (Signed)
[facility] New Patient Evaluation and Consultation  Referring Provider: Laurann Montana, MD PCP: Laurann Montana, MD Date of Service: 08/26/2022  SUBJECTIVE Chief Complaint: New Patient (Initial Visit) Angel KitchenTikia Cowin is a 72 y.o. female here for a consult for IC and urinary urgency./)  History of Present Illness: Angel Warren is a 72 y.o. White or Caucasian female seen in consultation at the request of Dr. Cliffton Asters for evaluation of painful bladder and incontinence.    Review of records from Dr Cliffton Asters significant for: Has had painful bladder spasms. Had done pelvic PT, avoids dietaty triggers. Also has urgency incontinence. Has to change pads several times per day  Urinary Symptoms: Leaks urine with cough/ sneeze, lifting, going from sitting to standing, with a full bladder, with movement to the bathroom, with urgency, and without sensation Leakage is variable, 2-6 per day Pad use: 1-6 pads per day.   She is bothered by her UI symptoms. Tried Myrbetriq but had a bad reaction and swelling. Prescribed anticholinergics but was hesitant to take them due to side effects due to age.  Has done urodynamic testing at [facility].   Day time voids 8-10.  Nocturia: 1-2 times per night to void. Voiding dysfunction: she empties her bladder well.  does not use a catheter to empty bladder.  When urinating, she feels a weak stream, difficulty starting urine stream, and the need to urinate multiple times in a row Drinks: water, teacchino (no caffeine, prebiotic) per day. Tries to follow IC diet  UTIs: 2 UTI's in the last year.   Denies history of blood in urine and kidney or bladder stones  Pelvic Organ Prolapse Symptoms:                  She Denies a feeling of a bulge the vaginal area.   Bowel Symptom: Bowel movements: 1-2 time(s) per day Stool consistency: soft  Straining: yes, sometimes Splinting: yes, sometimes Incomplete evacuation: no.  She Admits to accidental  bowel leakage / fecal incontinence  Occurs: 1-2 time(s), but not a regular issue  Consistency with leakage: loose Bowel regimen: none  Sexual Function Sexually active: no.   Pelvic Pain Admits to pelvic pain Location: bladder, pelvis Feels a tightening along the lower abdomen and makes it difficult to void at times. Last flare lasted several months. Symptoms have resolved currently.  Pain occurs: with interstitial cystitis flareup Prior pain treatment: physical therapy at [facility], and [facility] Improved by: heat, stretching, bladder rest tablet, azo   Has tried muscle relaxants before but for other joints.     Past Medical History:  Past Medical History:  Diagnosis Date   Allergic rhinitis    Anxiety    Aortic atherosclerosis (HCC)    Atrophic vaginitis    CAD (coronary artery disease), native coronary artery    Coronary CTA showed an elevated coronary calcium score of 119 with mild nonobstructive calcified plaque in the proximal LAD with 25 to 49% stenosis by CTA 10/2021   Chronic kidney disease (CKD), stage III (moderate) (HCC)    COVID    Genital herpes simplex    Grief    Hyperlipidemia    Interstitial cystitis    Mitral valve prolapse    bileaflet with mild MR by echo 10/2021   Osteoarthritis of both knees    Overactive bladder    Seasonal allergies    Tinnitus of both ears    Urge incontinence    Vaginal dryness, menopausal  Past Surgical History:   Past Surgical History:  Procedure Laterality Date   KNEE SURGERY Left 2018   2019 for right kee   TONSILLECTOMY     WISDOM TOOTH EXTRACTION       Past OB/GYN History: OB History  Gravida Para Term Preterm AB Living  2 2 2     2   SAB IAB Ectopic Multiple Live Births          2    # Outcome Date GA Lbr Len/2nd Weight Sex Delivery Anes PTL Lv  2 Term      Vag-Spont     1 Term      Vag-Spont       Menopausal: Denies vaginal bleeding since menopause Last pap smear was 04/2020-  negative.     Medications: She has a current medication list which includes the following prescription(s): alendronate, aspirin ec, cetirizine, cyclobenzaprine, repatha sureclick, ezetimibe, fluticasone, methylcobalamin, OVER THE COUNTER MEDICATION, turmeric, valacyclovir, vitamin d, estradiol, glucosamine-chondroitin, and triamcinolone acetonide.   Allergies: Patient is allergic to augmentin [amoxicillin-pot clavulanate], cefdinir, celecoxib, levaquin [levofloxacin], myrbetriq [mirabegron], penicillins, statins, sulfamethoxazole, and yuvafem [estradiol].   Social History:  Social History   Tobacco Use   Smoking status: Never   Smokeless tobacco: Never  Vaping Use   Vaping Use: Never used  Substance Use Topics   Alcohol use: Yes    Comment: OCCASIONALLY   Drug use: Not Currently    Relationship status: single/ divorced She lives alone She is employed as a part time Set designer. Regular exercise: Yes: swimming, walking, hiking, gym, bike, kayak History of abuse: No  Family History:   Family History  Problem Relation Age of Onset   Cancer Mother    Hypertension Father    Cancer Father    Hypertension Brother    Asthma Maternal Grandmother    Heart attack Maternal Grandfather    Heart disease Paternal Grandmother    Breast cancer Neg Hx      Review of Systems: Review of Systems  Constitutional:  Positive for malaise/fatigue. Negative for fever and weight loss.  Respiratory:  Negative for cough, shortness of breath and wheezing.   Cardiovascular:  Negative for chest pain, palpitations and leg swelling.  Gastrointestinal:  Negative for abdominal pain and blood in stool.  Genitourinary:  Negative for dysuria.  Musculoskeletal:  Negative for myalgias.  Skin:  Negative for rash.  Neurological:  Negative for dizziness and headaches.  Endo/Heme/Allergies:  Does not bruise/bleed easily.  Psychiatric/Behavioral:  Negative for depression. The patient is not nervous/anxious.       OBJECTIVE Physical Exam: Vitals:   08/26/22 1312  BP: 101/67  Pulse: (!) 101  Weight: 131 lb (59.4 kg)  Height: 4\' 11"  (1.499 m)    Physical Exam Constitutional:      General: She is not in acute distress. Pulmonary:     Effort: Pulmonary effort is normal.  Abdominal:     General: There is no distension.     Palpations: Abdomen is soft.     Tenderness: There is no abdominal tenderness. There is no rebound.  Musculoskeletal:        General: No swelling. Normal range of motion.  Skin:    General: Skin is warm and dry.     Findings: No rash.  Neurological:     Mental Status: She is alert and oriented to person, place, and time.  Psychiatric:        Mood and Affect: Mood normal.  Behavior: Behavior normal.      GU / Detailed Urogynecologic Evaluation:  Pelvic Exam: Normal external female genitalia; Bartholin's and Skene's glands normal in appearance; urethral meatus normal in appearance, no urethral masses or discharge.   CST: negative  Speculum exam reveals normal vaginal mucosa with atrophy. Cervix normal appearance. Uterus normal single, nontender. Adnexa no mass, fullness, tenderness.     Pelvic floor strength I/V, EAS strength III/V  Pelvic floor musculature: Right levator non-tender, Right obturator non-tender, Left levator non-tender, Left obturator non-tender  POP-Q:   POP-Q  -1                                            Aa   -1                                           Ba  -5                                              C   2                                            Gh  3                                            Pb  7.5                                            tvl   -2                                            Ap  -2                                            Bp  -6.5                                              D      Rectal Exam:  Normal sphincter tone, small distal rectocele, enterocoele not present, no rectal  masses, no sign of dyssynergia when asking the patient to bear down.  Post-Void Residual (PVR) by Bladder Scan: In order to evaluate bladder emptying, we discussed obtaining a postvoid residual and she agreed to this procedure.  Procedure: The ultrasound unit was placed on the patient's abdomen in the suprapubic region after the patient had voided. A PVR of 17 ml was obtained by bladder  scan.  Laboratory Results: POC urine: trace blood otherwise negative   ASSESSMENT AND PLAN Ms. Moschetto is a 72 y.o. with:  1. Bladder pain   2. Levator spasm   3. Urinary frequency   4. Overactive bladder    Bladder pain - For irritative bladder we reviewed treatment options including altering her diet to avoid irritative beverages and foods as well as attempting to decrease stress and other exacerbating factors.  We also discussed using pyridium and similar over-the-counter medications for pain relief as needed. We discussed the pentad of medications including Elmiron, Tums, an antihistamine such as Vistaril, amitriptyline, and L-arginine.  We also discussed in-office bladder instillations for pain flares, as well as cystoscopy with hydrodistention in the operating room, which can be both diagnostic and therapeutic.   - Currently she is taking bladder rest which contains L-arginine.  - Recommended taking daily anti-histamine as she recognizes that she has flares around the times that her allergies start. Can take Cetirizine over the counter.  - If she starts to have symptom flares, recommend coming to the office for a bladder instillation for treatment.   2. Levator spasm - Treatment options include pelvic floor physical therapy, local (vaginal) or oral  muscle relaxants, pelvic muscle trigger point injections or centrally acting pain medications.   - No significant levator spasm noted today but suspect this plays a role during symptoms flares based on her pain description. Has attended pelvic PT previously  and uses techniques from these visits to help decrease pain.  - Has taken flexeril previously for muscle spasm. Prescribed flexeril 5mg  up to three times daily as needed when she has pain flares.   3. OAB - We discussed the symptoms of overactive bladder (OAB), which include urinary urgency, urinary frequency, nocturia, with or without urge incontinence.  While we do not know the exact etiology of OAB, several treatment options exist. We discussed management including behavioral therapy (decreasing bladder irritants, urge suppression strategies, timed voids, bladder retraining), physical therapy, medication; for refractory cases posterior tibial nerve stimulation, sacral neuromodulation, and intravesical botulinum toxin injection.  - Previously had reaction to Myrbetriq (leg swelling), can consider Gemtesa but we reviewed this is in the same medication class.  - Has concerns about anticholinergics due to age. We discussed the option of trospium, which is a larger molecule that does not cross the blood brain barrier, so theoretically safer for older adults.  - She would be a good candidate for either botox or SNM, as they can also benefit bladder pain symptoms, and information provided to her about these options.   Return 2 months for follow up or sooner if needed  Marguerita Beards, MD

## 2022-08-27 ENCOUNTER — Encounter: Payer: Self-pay | Admitting: Obstetrics and Gynecology

## 2022-08-31 DIAGNOSIS — M25511 Pain in right shoulder: Secondary | ICD-10-CM | POA: Diagnosis not present

## 2022-09-08 DIAGNOSIS — M5030 Other cervical disc degeneration, unspecified cervical region: Secondary | ICD-10-CM | POA: Diagnosis not present

## 2022-09-08 DIAGNOSIS — M7541 Impingement syndrome of right shoulder: Secondary | ICD-10-CM | POA: Diagnosis not present

## 2022-09-08 DIAGNOSIS — Z4789 Encounter for other orthopedic aftercare: Secondary | ICD-10-CM | POA: Diagnosis not present

## 2022-09-15 DIAGNOSIS — M1712 Unilateral primary osteoarthritis, left knee: Secondary | ICD-10-CM | POA: Diagnosis not present

## 2022-09-15 DIAGNOSIS — G8918 Other acute postprocedural pain: Secondary | ICD-10-CM | POA: Diagnosis not present

## 2022-09-15 DIAGNOSIS — Z96652 Presence of left artificial knee joint: Secondary | ICD-10-CM | POA: Diagnosis not present

## 2022-09-18 DIAGNOSIS — M1712 Unilateral primary osteoarthritis, left knee: Secondary | ICD-10-CM | POA: Diagnosis not present

## 2022-09-21 ENCOUNTER — Encounter: Payer: Self-pay | Admitting: *Deleted

## 2022-09-21 DIAGNOSIS — M1712 Unilateral primary osteoarthritis, left knee: Secondary | ICD-10-CM | POA: Diagnosis not present

## 2022-09-24 DIAGNOSIS — M1712 Unilateral primary osteoarthritis, left knee: Secondary | ICD-10-CM | POA: Diagnosis not present

## 2022-09-28 DIAGNOSIS — M1712 Unilateral primary osteoarthritis, left knee: Secondary | ICD-10-CM | POA: Diagnosis not present

## 2022-10-01 DIAGNOSIS — M1712 Unilateral primary osteoarthritis, left knee: Secondary | ICD-10-CM | POA: Diagnosis not present

## 2022-10-05 DIAGNOSIS — M1712 Unilateral primary osteoarthritis, left knee: Secondary | ICD-10-CM | POA: Diagnosis not present

## 2022-10-08 DIAGNOSIS — M1712 Unilateral primary osteoarthritis, left knee: Secondary | ICD-10-CM | POA: Diagnosis not present

## 2022-10-13 DIAGNOSIS — M1712 Unilateral primary osteoarthritis, left knee: Secondary | ICD-10-CM | POA: Diagnosis not present

## 2022-10-14 ENCOUNTER — Ambulatory Visit (HOSPITAL_COMMUNITY): Payer: Medicare PPO | Attending: Cardiovascular Disease

## 2022-10-14 DIAGNOSIS — I341 Nonrheumatic mitral (valve) prolapse: Secondary | ICD-10-CM | POA: Insufficient documentation

## 2022-10-14 LAB — ECHOCARDIOGRAM COMPLETE
Area-P 1/2: 2.61 cm2
S' Lateral: 2.6 cm

## 2022-10-15 ENCOUNTER — Telehealth: Payer: Self-pay

## 2022-10-15 ENCOUNTER — Encounter (HOSPITAL_BASED_OUTPATIENT_CLINIC_OR_DEPARTMENT_OTHER): Payer: Self-pay | Admitting: Cardiology

## 2022-10-15 DIAGNOSIS — M1712 Unilateral primary osteoarthritis, left knee: Secondary | ICD-10-CM | POA: Diagnosis not present

## 2022-10-15 NOTE — Telephone Encounter (Signed)
Reviewed results of patient's echo, patient verbalizes understanding and states that this sounds "About the same" as her previous echocardiogram. She denies any palpitations, fatigue, syncope or near-syncope. No orders placed at this time.

## 2022-10-15 NOTE — Telephone Encounter (Signed)
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

## 2022-10-19 DIAGNOSIS — M1712 Unilateral primary osteoarthritis, left knee: Secondary | ICD-10-CM | POA: Diagnosis not present

## 2022-10-20 DIAGNOSIS — Z5189 Encounter for other specified aftercare: Secondary | ICD-10-CM | POA: Diagnosis not present

## 2022-10-22 DIAGNOSIS — M1712 Unilateral primary osteoarthritis, left knee: Secondary | ICD-10-CM | POA: Diagnosis not present

## 2022-10-26 DIAGNOSIS — M1712 Unilateral primary osteoarthritis, left knee: Secondary | ICD-10-CM | POA: Diagnosis not present

## 2022-10-28 DIAGNOSIS — M1712 Unilateral primary osteoarthritis, left knee: Secondary | ICD-10-CM | POA: Diagnosis not present

## 2022-11-02 DIAGNOSIS — M1712 Unilateral primary osteoarthritis, left knee: Secondary | ICD-10-CM | POA: Diagnosis not present

## 2022-11-05 DIAGNOSIS — M1712 Unilateral primary osteoarthritis, left knee: Secondary | ICD-10-CM | POA: Diagnosis not present

## 2022-11-09 DIAGNOSIS — M1712 Unilateral primary osteoarthritis, left knee: Secondary | ICD-10-CM | POA: Diagnosis not present

## 2022-11-12 DIAGNOSIS — H2513 Age-related nuclear cataract, bilateral: Secondary | ICD-10-CM | POA: Diagnosis not present

## 2022-11-12 DIAGNOSIS — M1712 Unilateral primary osteoarthritis, left knee: Secondary | ICD-10-CM | POA: Diagnosis not present

## 2022-11-12 DIAGNOSIS — H43393 Other vitreous opacities, bilateral: Secondary | ICD-10-CM | POA: Diagnosis not present

## 2022-11-12 DIAGNOSIS — H35372 Puckering of macula, left eye: Secondary | ICD-10-CM | POA: Diagnosis not present

## 2022-11-12 DIAGNOSIS — H10413 Chronic giant papillary conjunctivitis, bilateral: Secondary | ICD-10-CM | POA: Diagnosis not present

## 2022-11-12 DIAGNOSIS — H179 Unspecified corneal scar and opacity: Secondary | ICD-10-CM | POA: Diagnosis not present

## 2022-11-13 DIAGNOSIS — Z5189 Encounter for other specified aftercare: Secondary | ICD-10-CM | POA: Diagnosis not present

## 2022-11-16 DIAGNOSIS — M1712 Unilateral primary osteoarthritis, left knee: Secondary | ICD-10-CM | POA: Diagnosis not present

## 2022-11-19 DIAGNOSIS — M1712 Unilateral primary osteoarthritis, left knee: Secondary | ICD-10-CM | POA: Diagnosis not present

## 2022-11-21 DIAGNOSIS — H00022 Hordeolum internum right lower eyelid: Secondary | ICD-10-CM | POA: Diagnosis not present

## 2022-11-23 DIAGNOSIS — M1712 Unilateral primary osteoarthritis, left knee: Secondary | ICD-10-CM | POA: Diagnosis not present

## 2022-11-26 DIAGNOSIS — F4323 Adjustment disorder with mixed anxiety and depressed mood: Secondary | ICD-10-CM | POA: Diagnosis not present

## 2022-11-27 DIAGNOSIS — M1712 Unilateral primary osteoarthritis, left knee: Secondary | ICD-10-CM | POA: Diagnosis not present

## 2022-11-30 DIAGNOSIS — M1712 Unilateral primary osteoarthritis, left knee: Secondary | ICD-10-CM | POA: Diagnosis not present

## 2022-12-02 DIAGNOSIS — M1712 Unilateral primary osteoarthritis, left knee: Secondary | ICD-10-CM | POA: Diagnosis not present

## 2022-12-02 DIAGNOSIS — F4323 Adjustment disorder with mixed anxiety and depressed mood: Secondary | ICD-10-CM | POA: Diagnosis not present

## 2022-12-04 DIAGNOSIS — W57XXXA Bitten or stung by nonvenomous insect and other nonvenomous arthropods, initial encounter: Secondary | ICD-10-CM | POA: Diagnosis not present

## 2022-12-04 DIAGNOSIS — J301 Allergic rhinitis due to pollen: Secondary | ICD-10-CM | POA: Diagnosis not present

## 2022-12-04 DIAGNOSIS — S40862A Insect bite (nonvenomous) of left upper arm, initial encounter: Secondary | ICD-10-CM | POA: Diagnosis not present

## 2022-12-04 DIAGNOSIS — R5383 Other fatigue: Secondary | ICD-10-CM | POA: Diagnosis not present

## 2022-12-04 DIAGNOSIS — L299 Pruritus, unspecified: Secondary | ICD-10-CM | POA: Diagnosis not present

## 2022-12-07 DIAGNOSIS — M1712 Unilateral primary osteoarthritis, left knee: Secondary | ICD-10-CM | POA: Diagnosis not present

## 2022-12-09 DIAGNOSIS — M1712 Unilateral primary osteoarthritis, left knee: Secondary | ICD-10-CM | POA: Diagnosis not present

## 2022-12-11 DIAGNOSIS — F4323 Adjustment disorder with mixed anxiety and depressed mood: Secondary | ICD-10-CM | POA: Diagnosis not present

## 2022-12-16 DIAGNOSIS — F4323 Adjustment disorder with mixed anxiety and depressed mood: Secondary | ICD-10-CM | POA: Diagnosis not present

## 2022-12-17 DIAGNOSIS — M1712 Unilateral primary osteoarthritis, left knee: Secondary | ICD-10-CM | POA: Diagnosis not present

## 2022-12-18 DIAGNOSIS — W57XXXD Bitten or stung by nonvenomous insect and other nonvenomous arthropods, subsequent encounter: Secondary | ICD-10-CM | POA: Diagnosis not present

## 2022-12-18 DIAGNOSIS — R202 Paresthesia of skin: Secondary | ICD-10-CM | POA: Diagnosis not present

## 2023-01-19 ENCOUNTER — Other Ambulatory Visit: Payer: Self-pay | Admitting: Cardiology

## 2023-02-26 DIAGNOSIS — Z96652 Presence of left artificial knee joint: Secondary | ICD-10-CM | POA: Diagnosis not present

## 2023-02-26 DIAGNOSIS — Z5189 Encounter for other specified aftercare: Secondary | ICD-10-CM | POA: Diagnosis not present

## 2023-03-04 DIAGNOSIS — Z6826 Body mass index (BMI) 26.0-26.9, adult: Secondary | ICD-10-CM | POA: Diagnosis not present

## 2023-03-04 DIAGNOSIS — M25561 Pain in right knee: Secondary | ICD-10-CM | POA: Diagnosis not present

## 2023-03-04 DIAGNOSIS — R5383 Other fatigue: Secondary | ICD-10-CM | POA: Diagnosis not present

## 2023-03-04 DIAGNOSIS — M25511 Pain in right shoulder: Secondary | ICD-10-CM | POA: Diagnosis not present

## 2023-03-04 DIAGNOSIS — R5381 Other malaise: Secondary | ICD-10-CM | POA: Diagnosis not present

## 2023-03-04 DIAGNOSIS — E663 Overweight: Secondary | ICD-10-CM | POA: Diagnosis not present

## 2023-03-04 DIAGNOSIS — M542 Cervicalgia: Secondary | ICD-10-CM | POA: Diagnosis not present

## 2023-03-04 DIAGNOSIS — M792 Neuralgia and neuritis, unspecified: Secondary | ICD-10-CM | POA: Diagnosis not present

## 2023-03-10 DIAGNOSIS — M25511 Pain in right shoulder: Secondary | ICD-10-CM | POA: Diagnosis not present

## 2023-03-25 DIAGNOSIS — M25511 Pain in right shoulder: Secondary | ICD-10-CM | POA: Diagnosis not present

## 2023-03-29 DIAGNOSIS — S46011D Strain of muscle(s) and tendon(s) of the rotator cuff of right shoulder, subsequent encounter: Secondary | ICD-10-CM | POA: Diagnosis not present

## 2023-03-29 DIAGNOSIS — M47812 Spondylosis without myelopathy or radiculopathy, cervical region: Secondary | ICD-10-CM | POA: Diagnosis not present

## 2023-03-31 DIAGNOSIS — M25511 Pain in right shoulder: Secondary | ICD-10-CM | POA: Diagnosis not present

## 2023-03-31 DIAGNOSIS — M25561 Pain in right knee: Secondary | ICD-10-CM | POA: Diagnosis not present

## 2023-03-31 DIAGNOSIS — Z6826 Body mass index (BMI) 26.0-26.9, adult: Secondary | ICD-10-CM | POA: Diagnosis not present

## 2023-03-31 DIAGNOSIS — E663 Overweight: Secondary | ICD-10-CM | POA: Diagnosis not present

## 2023-03-31 DIAGNOSIS — M1991 Primary osteoarthritis, unspecified site: Secondary | ICD-10-CM | POA: Diagnosis not present

## 2023-03-31 DIAGNOSIS — M542 Cervicalgia: Secondary | ICD-10-CM | POA: Diagnosis not present

## 2023-03-31 DIAGNOSIS — F4323 Adjustment disorder with mixed anxiety and depressed mood: Secondary | ICD-10-CM | POA: Diagnosis not present

## 2023-03-31 DIAGNOSIS — M792 Neuralgia and neuritis, unspecified: Secondary | ICD-10-CM | POA: Diagnosis not present

## 2023-04-01 DIAGNOSIS — M47812 Spondylosis without myelopathy or radiculopathy, cervical region: Secondary | ICD-10-CM | POA: Diagnosis not present

## 2023-04-01 DIAGNOSIS — M75101 Unspecified rotator cuff tear or rupture of right shoulder, not specified as traumatic: Secondary | ICD-10-CM | POA: Diagnosis not present

## 2023-04-01 DIAGNOSIS — S46011D Strain of muscle(s) and tendon(s) of the rotator cuff of right shoulder, subsequent encounter: Secondary | ICD-10-CM | POA: Diagnosis not present

## 2023-04-08 DIAGNOSIS — S46011D Strain of muscle(s) and tendon(s) of the rotator cuff of right shoulder, subsequent encounter: Secondary | ICD-10-CM | POA: Diagnosis not present

## 2023-04-08 DIAGNOSIS — M47812 Spondylosis without myelopathy or radiculopathy, cervical region: Secondary | ICD-10-CM | POA: Diagnosis not present

## 2023-04-12 DIAGNOSIS — S46011D Strain of muscle(s) and tendon(s) of the rotator cuff of right shoulder, subsequent encounter: Secondary | ICD-10-CM | POA: Diagnosis not present

## 2023-04-12 DIAGNOSIS — M47812 Spondylosis without myelopathy or radiculopathy, cervical region: Secondary | ICD-10-CM | POA: Diagnosis not present

## 2023-04-29 DIAGNOSIS — S46011D Strain of muscle(s) and tendon(s) of the rotator cuff of right shoulder, subsequent encounter: Secondary | ICD-10-CM | POA: Diagnosis not present

## 2023-04-29 DIAGNOSIS — M47812 Spondylosis without myelopathy or radiculopathy, cervical region: Secondary | ICD-10-CM | POA: Diagnosis not present

## 2023-05-10 ENCOUNTER — Other Ambulatory Visit: Payer: Self-pay | Admitting: Cardiology

## 2023-06-07 ENCOUNTER — Other Ambulatory Visit: Payer: Self-pay | Admitting: Family Medicine

## 2023-06-07 DIAGNOSIS — Z1231 Encounter for screening mammogram for malignant neoplasm of breast: Secondary | ICD-10-CM

## 2023-06-11 DIAGNOSIS — M47812 Spondylosis without myelopathy or radiculopathy, cervical region: Secondary | ICD-10-CM | POA: Diagnosis not present

## 2023-06-11 DIAGNOSIS — S46011D Strain of muscle(s) and tendon(s) of the rotator cuff of right shoulder, subsequent encounter: Secondary | ICD-10-CM | POA: Diagnosis not present

## 2023-06-16 DIAGNOSIS — H00015 Hordeolum externum left lower eyelid: Secondary | ICD-10-CM | POA: Diagnosis not present

## 2023-06-16 DIAGNOSIS — M47812 Spondylosis without myelopathy or radiculopathy, cervical region: Secondary | ICD-10-CM | POA: Diagnosis not present

## 2023-06-16 DIAGNOSIS — S46011D Strain of muscle(s) and tendon(s) of the rotator cuff of right shoulder, subsequent encounter: Secondary | ICD-10-CM | POA: Diagnosis not present

## 2023-06-16 DIAGNOSIS — Z6825 Body mass index (BMI) 25.0-25.9, adult: Secondary | ICD-10-CM | POA: Diagnosis not present

## 2023-06-21 ENCOUNTER — Telehealth: Payer: Self-pay | Admitting: Student

## 2023-06-21 DIAGNOSIS — S46011D Strain of muscle(s) and tendon(s) of the rotator cuff of right shoulder, subsequent encounter: Secondary | ICD-10-CM | POA: Diagnosis not present

## 2023-06-21 DIAGNOSIS — E785 Hyperlipidemia, unspecified: Secondary | ICD-10-CM

## 2023-06-21 DIAGNOSIS — M47812 Spondylosis without myelopathy or radiculopathy, cervical region: Secondary | ICD-10-CM | POA: Diagnosis not present

## 2023-06-21 NOTE — Telephone Encounter (Signed)
Spoke to patient who states she has been having diarrhea that comes an goes she she started taking Zetia. Patent report she has been on Zetia over a year and has had diarrhea that has been coming and going the whole time she has just been tolerating it. She stated she has been trying to eliminate certain foods and change her diet to see if that was the cause. She stopped the Zetia for a few days and did not have diarrhea but when she started back taking the medication the diarrhea started back. She finally stopped taking the medication 10/30 for good. She stated her cholesterol had been stable. Please advise concerning Zetia.

## 2023-06-21 NOTE — Telephone Encounter (Signed)
Pt c/o medication issue:  1. Name of Medication:   ezetimibe (ZETIA) 10 MG tablet    2. How are you currently taking this medication (dosage and times per day)?   Take 1 tablet (10 mg total) by mouth daily.    3. Are you having a reaction (difficulty breathing--STAT)? No  4. What is your medication issue? Pt states that medication is causing her to have diarrhea. She would like a c/b regarding this matter. Please advise

## 2023-06-21 NOTE — Telephone Encounter (Signed)
Sent patient a MyChart message about lab work requested by Dr. Mayford Knife, please see message from today.  FLP and ALT ordered, orders released.

## 2023-06-22 DIAGNOSIS — D225 Melanocytic nevi of trunk: Secondary | ICD-10-CM | POA: Diagnosis not present

## 2023-06-22 DIAGNOSIS — L578 Other skin changes due to chronic exposure to nonionizing radiation: Secondary | ICD-10-CM | POA: Diagnosis not present

## 2023-06-22 DIAGNOSIS — L821 Other seborrheic keratosis: Secondary | ICD-10-CM | POA: Diagnosis not present

## 2023-06-22 DIAGNOSIS — M25511 Pain in right shoulder: Secondary | ICD-10-CM | POA: Diagnosis not present

## 2023-06-22 DIAGNOSIS — L814 Other melanin hyperpigmentation: Secondary | ICD-10-CM | POA: Diagnosis not present

## 2023-06-24 DIAGNOSIS — F411 Generalized anxiety disorder: Secondary | ICD-10-CM | POA: Diagnosis not present

## 2023-06-25 NOTE — Telephone Encounter (Signed)
Patient calling back states she has been having diarrhea that comes an goes she she started taking Zetia. Patent report she has been on Zetia over a year and has had diarrhea that has been coming and going the whole time she has just been tolerating it. She stated she has been trying to eliminate certain foods and change her diet to see if that was the cause. She stopped the Zetia for a few days and did not have diarrhea but when she started back taking the medication the diarrhea started back. She finally stopped taking the medication 10/30 for good. She stated her cholesterol had been stable. Please advise concerning Zetia.

## 2023-06-28 NOTE — Telephone Encounter (Signed)
Patient with questions about Zetia, Mychart message was sent but shows as unread. Call to patient, no answer, no DPR on file, left message with no identifiers asking recipient to call our office #.

## 2023-06-28 NOTE — Telephone Encounter (Signed)
Pt called in to f/u on this message

## 2023-07-06 NOTE — Telephone Encounter (Signed)
Call to review Mychart message that is showing unread:  "I am following up on your call regarding the Zetia. Dr. Mayford Knife would like to recheck your labs in about 6 weeks to see how your cholesterol and liver enzymes look after stopping the Zetia.   You would need to have your labs drawn the week of December 30th or January 6th. You do not need an appointment. Just come by our lab on the 3rd floor any time between 7:30am and 12:30pm. Please note these are fasting labs."  Patient states she is feeling better since being off the zetia and has upcoming PCP appt where she will have lipid panel drawn. Asked patient to forward those results to [place], patient verbalizes understanding and agrees to plan.

## 2023-07-09 DIAGNOSIS — U071 COVID-19: Secondary | ICD-10-CM | POA: Diagnosis not present

## 2023-07-16 DIAGNOSIS — S46011D Strain of muscle(s) and tendon(s) of the rotator cuff of right shoulder, subsequent encounter: Secondary | ICD-10-CM | POA: Diagnosis not present

## 2023-07-16 DIAGNOSIS — M47812 Spondylosis without myelopathy or radiculopathy, cervical region: Secondary | ICD-10-CM | POA: Diagnosis not present

## 2023-07-19 DIAGNOSIS — N3010 Interstitial cystitis (chronic) without hematuria: Secondary | ICD-10-CM | POA: Diagnosis not present

## 2023-07-19 DIAGNOSIS — I340 Nonrheumatic mitral (valve) insufficiency: Secondary | ICD-10-CM | POA: Diagnosis not present

## 2023-07-19 DIAGNOSIS — F419 Anxiety disorder, unspecified: Secondary | ICD-10-CM | POA: Diagnosis not present

## 2023-07-19 DIAGNOSIS — I2510 Atherosclerotic heart disease of native coronary artery without angina pectoris: Secondary | ICD-10-CM | POA: Diagnosis not present

## 2023-07-19 DIAGNOSIS — N3281 Overactive bladder: Secondary | ICD-10-CM | POA: Diagnosis not present

## 2023-07-19 DIAGNOSIS — Z0000 Encounter for general adult medical examination without abnormal findings: Secondary | ICD-10-CM | POA: Diagnosis not present

## 2023-07-19 DIAGNOSIS — M8588 Other specified disorders of bone density and structure, other site: Secondary | ICD-10-CM | POA: Diagnosis not present

## 2023-07-19 DIAGNOSIS — G720 Drug-induced myopathy: Secondary | ICD-10-CM | POA: Diagnosis not present

## 2023-07-19 DIAGNOSIS — E785 Hyperlipidemia, unspecified: Secondary | ICD-10-CM | POA: Diagnosis not present

## 2023-07-20 ENCOUNTER — Other Ambulatory Visit: Payer: Self-pay | Admitting: Family Medicine

## 2023-07-20 DIAGNOSIS — M8588 Other specified disorders of bone density and structure, other site: Secondary | ICD-10-CM

## 2023-07-22 ENCOUNTER — Ambulatory Visit
Admission: RE | Admit: 2023-07-22 | Discharge: 2023-07-22 | Disposition: A | Discharge status: Home / Self Care | Payer: Medicare PPO | Source: Ambulatory Visit | Attending: Family Medicine | Admitting: Family Medicine

## 2023-07-22 DIAGNOSIS — Z1231 Encounter for screening mammogram for malignant neoplasm of breast: Secondary | ICD-10-CM

## 2023-07-23 ENCOUNTER — Telehealth: Payer: Self-pay

## 2023-07-23 DIAGNOSIS — E785 Hyperlipidemia, unspecified: Secondary | ICD-10-CM

## 2023-07-23 NOTE — Telephone Encounter (Signed)
Call to patient to ask if she has missed any repatha doses. No answer, no DPR on file. Left message with no identifiers asking recipient to call [place] at our office number.

## 2023-07-23 NOTE — Telephone Encounter (Signed)
-----   Message from Armanda Magic sent at 07/21/2023  6:18 AM EST ----- LDL goal < 70 due to CAD but 79 on last labs - please find out if patient has missed any Repatha ----- Message ----- From: Royann Shivers Sent: 07/20/2023   4:47 PM EST To: Quintella Reichert, MD; Luellen Pucker, RN

## 2023-07-26 ENCOUNTER — Telehealth: Payer: Self-pay | Admitting: Cardiology

## 2023-07-26 NOTE — Telephone Encounter (Signed)
Follow Up:       Patient is returning Erica's call from Friday(07-23-23)

## 2023-07-26 NOTE — Addendum Note (Signed)
Addended by: Judene Companion on: 07/26/2023 03:05 PM   Modules accepted: Orders

## 2023-07-26 NOTE — Telephone Encounter (Signed)
Duplicate encounter. Please see previous encounter.  

## 2023-07-26 NOTE — Telephone Encounter (Addendum)
Left voicemail to return call to office.  Referral placed for lipid clinic  Scheduling can you call to set up appointment for lipid clinic

## 2023-07-26 NOTE — Telephone Encounter (Signed)
Spoke with patient and she stated she only missed 1 or 2 doses during the year nothing drastic.   She states she also wanted you to know she had explosive diarrhea to the Zetia so she can not take that if you wanted to add another medication.

## 2023-07-29 ENCOUNTER — Encounter (HOSPITAL_BASED_OUTPATIENT_CLINIC_OR_DEPARTMENT_OTHER): Payer: Self-pay | Admitting: Cardiology

## 2023-07-30 ENCOUNTER — Telehealth: Payer: Self-pay | Admitting: Cardiology

## 2023-07-30 NOTE — Telephone Encounter (Signed)
Call to patient who verbalizes understanding that goal of referral to lipid clinic is to optimize cholesterol numbers with minimal side effects from medication. Also provided education that the pharmacists will look at insurance coverage/medication cost as well.

## 2023-07-30 NOTE — Telephone Encounter (Signed)
Patient wants a call back to discuss referral to the pharmacist.

## 2023-08-01 DIAGNOSIS — R300 Dysuria: Secondary | ICD-10-CM | POA: Diagnosis not present

## 2023-08-01 DIAGNOSIS — R52 Pain, unspecified: Secondary | ICD-10-CM | POA: Diagnosis not present

## 2023-08-01 DIAGNOSIS — R509 Fever, unspecified: Secondary | ICD-10-CM | POA: Diagnosis not present

## 2023-08-01 DIAGNOSIS — N3000 Acute cystitis without hematuria: Secondary | ICD-10-CM | POA: Diagnosis not present

## 2023-08-01 DIAGNOSIS — R399 Unspecified symptoms and signs involving the genitourinary system: Secondary | ICD-10-CM | POA: Diagnosis not present

## 2023-08-02 ENCOUNTER — Ambulatory Visit: Payer: Medicare PPO | Attending: Cardiovascular Disease | Admitting: Student

## 2023-08-02 ENCOUNTER — Encounter: Payer: Self-pay | Admitting: Student

## 2023-08-02 DIAGNOSIS — E785 Hyperlipidemia, unspecified: Secondary | ICD-10-CM | POA: Diagnosis not present

## 2023-08-02 MED ORDER — EZETIMIBE 10 MG PO TABS: 5.0000 mg | ORAL_TABLET | Freq: Every day | ORAL | 3 refills | Status: DC

## 2023-08-02 NOTE — Patient Instructions (Signed)
Your Results:             Your most recent labs Goal  Total Cholesterol 168 < 200  Triglycerides 125 < 150  HDL (happy/good cholesterol) 67 > 40  LDL (lousy/bad cholesterol 79 < 70   Medication changes: Start taking ezetimibe 5 mg daily   Lab orders: We want to repeat labs after 2-3 months.  We will send you a lab order to remind you once we get closer to that time.

## 2023-08-02 NOTE — Assessment & Plan Note (Addendum)
Assessment:  LDL goal: < 70 mg/dl last LDLc 79 mg/dl (52/8413) Tolerates Repatha well without any side effects  Intolerance to statins due to myalgia and Zetia due to severe diarrhea  Discussed next potential options (pravastatin, bempedoic acid); cost, dosing efficacy, side effects  Due to past hx of tendon injury patient is reluctant to try bempedoic acid even the risk of tendon damage is remote from bempedoic acid, and refuse to try any statins  Willing to try Zetia half dose as her LDLc was 50 mg/dl Jan 2024 and she thinks at that point she was on Zetia and Repatha   Plan: Continue taking current medications (Repatha 140 mg SQ every 14 days) Take Zetia 5 mg daily with food and will repeat lab in 2-3 months

## 2023-08-02 NOTE — Progress Notes (Signed)
Patient ID: Angel Warren                 DOB: 08-16-1950                    MRN: [id number]      HPI: Angel Warren is a 72 y.o. female patient referred to lipid clinic by Dr.Turner. PMH is significant for CAD, HLD, CAC -110 (72 nd percentile),mitral valve abnormality. Patient presented today for lipid clinic. Reports she could not tolerate statins - she had tried atorvastatin and rosuvastatin in the past. She has to stopped taking it due to severe myalgia that was affecting her mobility. She was on ezetimibe that gave her initially loose bowel movement and then it caused severe diarrhea. However she was on antibiotics too for her sinus infection when she had severe diarrhea from Zetia. He diet is healthy and she exercises regularly. We reviewed pravastatin and bempedoic acid. Discussed mechanisms of action, dosing, side effects and potential decreases in LDL cholesterol. She had previous shoulder injuries that involves tendon damage even though her injury is old and there is remote risk of tendon injuries from bempedoic acid she is not willing to go on bempedoic acid. And refuse to try any other statins.     Current Medications: Repatha 140 mg SQ Q14D Intolerances: statins- myalgia, Zetia - severe diarrhea  Risk Factors: CAD,HDL,CAC score >100  LDL goal: <70 mg/dl  Last LDLc : 79  Diet: Mediterranean diet  Exercise: 4 times a week - yoga class and pilate class, loves to hike and walk   Family History:   Relation Problem Comments  Mother (Deceased) Cancer     Father (Deceased) Cancer   Hypertension     Sister Metallurgist)   Sister Metallurgist)   Sister Metallurgist)   Brother Metallurgist) Hypertension     Brother Metallurgist)   Brother (Alive)   Maternal Grandmother (Deceased) Asthma     Maternal Grandfather (Deceased) Heart attack     Paternal Grandmother (Deceased) Heart disease     Paternal Grandfather (Deceased)     Social History:   Labs: Lipid Panel     Component Value  Date/Time   CHOL 145 08/10/2022 0915   TRIG 108 08/10/2022 0915   HDL 76 08/10/2022 0915   CHOLHDL 1.9 08/10/2022 0915   LDLCALC 50 08/10/2022 0915   LABVLDL 19 08/10/2022 0915    Past Medical History:  Diagnosis Date   Allergic rhinitis    Anxiety    Aortic atherosclerosis (HCC)    Atrophic vaginitis    CAD (coronary artery disease), native coronary artery    Coronary CTA showed an elevated coronary calcium score of 119 with mild nonobstructive calcified plaque in the proximal LAD with 25 to 49% stenosis by CTA 10/2021   Chronic kidney disease (CKD), stage III (moderate) (HCC)    COVID    Genital herpes simplex    Grief    Hyperlipidemia    Interstitial cystitis    Mitral valve prolapse    bileaflet with mild MR by echo 10/2021   Osteoarthritis of both knees    Overactive bladder    Seasonal allergies    Tinnitus of both ears    Urge incontinence    Vaginal dryness, menopausal     Current Outpatient Medications on File Prior to Visit  Medication Sig Dispense Refill   alendronate (FOSAMAX) 70 MG tablet Take 70 mg by mouth once a week. Take with a full glass of  water on an empty stomach.     aspirin EC 81 MG tablet Take 1 tablet (81 mg total) by mouth every other day. Swallow whole.     cetirizine (ZYRTEC) 10 MG tablet Take 10 mg by mouth daily.     cyclobenzaprine (FLEXERIL) 5 MG tablet Take 1 tablet (5 mg total) by mouth 3 (three) times daily as needed for muscle spasms. 60 tablet 1   ESTRADIOL PO Place 1 drop vaginally at bedtime. (Patient not taking: Reported on 08/26/2022)     Evolocumab (REPATHA SURECLICK) 140 MG/ML SOAJ INJECT 140 MG INTO THE SKIN EVERY 14 (FOURTEEN) DAYS. 2 mL 11   fluticasone (FLONASE) 50 MCG/ACT nasal spray Place 2 sprays into both nostrils 3 times/day as needed-between meals & bedtime for allergies or rhinitis.     Glucosamine-Chondroitin (GLUCOSAMINE CHONDR COMPLEX PO) Take by mouth. (Patient not taking: Reported on 08/26/2022)     Methylcobalamin  (B12-ACTIVE PO) Take by mouth.     OVER THE COUNTER MEDICATION Take by mouth in the morning and at bedtime. BLADDER BUILDER     Triamcinolone Acetonide (NASACORT AQ NA) Place into the nose. (Patient not taking: Reported on 08/26/2022)     TURMERIC PO Take by mouth daily.     valACYclovir (VALTREX) 500 MG tablet Take 500 mg by mouth as needed (HERPES).     VITAMIN D PO Take 25 mcg by mouth.     No current facility-administered medications on file prior to visit.    Allergies  Allergen Reactions   Augmentin [Amoxicillin-Pot Clavulanate] Hives   Cefdinir    Celecoxib Hives   Levaquin [Levofloxacin]     DIZZINESS    Myrbetriq [Mirabegron]     EDEMA   Penicillins    Statins    Sulfamethoxazole Hives   Yuvafem [Estradiol]     Assessment/Plan:  1. Hyperlipidemia -  Problem  Hyperlipidemia Ldl Goal <70   Current Medications: Repatha 140 mg SQ Q14D Intolerances: statins- myalgia, Zetia - severe diarrhea  Risk Factors: CAD,HDL,CAC score >100  LDL goal: <70 mg/dl     Hyperlipidemia LDL goal <70 Assessment:  LDL goal: < 70 mg/dl last LDLc 79 mg/dl (06/9146) Tolerates Repatha well without any side effects  Intolerance to statins due to myalgia and Zetia due to severe diarrhea  Discussed next potential options (pravastatin, bempedoic acid); cost, dosing efficacy, side effects  Due to past hx of tendon injury patient is reluctant to try bempedoic acid even the risk of tendon damage is remote from bempedoic acid, and refuse to try any statins  Willing to try Zetia half dose as her LDLc was 50 mg/dl Jan 2024 and she thinks at that point she was on Zetia and Repatha   Plan: Continue taking current medications (Repatha 140 mg SQ every 14 days) Take Zetia 5 mg daily with food and will repeat lab in 2-3 months    Thank you,  Carmela Hurt, Pharm.D  [facility] [address] [place], [place] [address]  Phone: [phone]; Fax:  [phone]

## 2023-08-11 DIAGNOSIS — F4323 Adjustment disorder with mixed anxiety and depressed mood: Secondary | ICD-10-CM | POA: Diagnosis not present

## 2023-08-23 DIAGNOSIS — M25511 Pain in right shoulder: Secondary | ICD-10-CM | POA: Diagnosis not present

## 2023-08-26 DIAGNOSIS — F4323 Adjustment disorder with mixed anxiety and depressed mood: Secondary | ICD-10-CM | POA: Diagnosis not present

## 2023-08-27 DIAGNOSIS — M1711 Unilateral primary osteoarthritis, right knee: Secondary | ICD-10-CM | POA: Diagnosis not present

## 2023-08-27 DIAGNOSIS — Z96652 Presence of left artificial knee joint: Secondary | ICD-10-CM | POA: Diagnosis not present

## 2023-08-31 ENCOUNTER — Telehealth: Payer: Self-pay | Admitting: Pharmacist

## 2023-08-31 NOTE — Telephone Encounter (Signed)
Patient is returning call.

## 2023-08-31 NOTE — Telephone Encounter (Signed)
Spoke to pt she want to try Zetia 5 mg 3 times a week until mid Feb will let us know how she is able to tolerate that. She does not want to take bempedoic acid and any statins.

## 2023-08-31 NOTE — Telephone Encounter (Signed)
Call to f/u on Zetia. N/A LVM

## 2023-09-02 DIAGNOSIS — F4323 Adjustment disorder with mixed anxiety and depressed mood: Secondary | ICD-10-CM | POA: Diagnosis not present

## 2023-09-16 DIAGNOSIS — F4323 Adjustment disorder with mixed anxiety and depressed mood: Secondary | ICD-10-CM | POA: Diagnosis not present

## 2023-09-30 DIAGNOSIS — F4323 Adjustment disorder with mixed anxiety and depressed mood: Secondary | ICD-10-CM | POA: Diagnosis not present

## 2023-10-18 DIAGNOSIS — M8588 Other specified disorders of bone density and structure, other site: Secondary | ICD-10-CM | POA: Diagnosis not present

## 2023-10-20 NOTE — Telephone Encounter (Signed)
 Spoke to pt, reports she can only tolerate Zetia 5 mg 3 times a week without having upset stomach. So she will continue with that dose along with Q14D Repatha. Last LDLc 79-07/2023 (goal <70 mg/dl). Will repeat lab April 30,2025.

## 2023-10-28 DIAGNOSIS — H2513 Age-related nuclear cataract, bilateral: Secondary | ICD-10-CM | POA: Diagnosis not present

## 2023-10-28 DIAGNOSIS — H179 Unspecified corneal scar and opacity: Secondary | ICD-10-CM | POA: Diagnosis not present

## 2023-10-28 DIAGNOSIS — H43393 Other vitreous opacities, bilateral: Secondary | ICD-10-CM | POA: Diagnosis not present

## 2023-10-28 DIAGNOSIS — H00024 Hordeolum internum left upper eyelid: Secondary | ICD-10-CM | POA: Diagnosis not present

## 2023-10-28 DIAGNOSIS — H10413 Chronic giant papillary conjunctivitis, bilateral: Secondary | ICD-10-CM | POA: Diagnosis not present

## 2023-10-28 DIAGNOSIS — H35372 Puckering of macula, left eye: Secondary | ICD-10-CM | POA: Diagnosis not present

## 2023-11-18 DIAGNOSIS — M1711 Unilateral primary osteoarthritis, right knee: Secondary | ICD-10-CM | POA: Diagnosis not present

## 2023-11-23 DIAGNOSIS — M25511 Pain in right shoulder: Secondary | ICD-10-CM | POA: Diagnosis not present

## 2023-12-01 DIAGNOSIS — E785 Hyperlipidemia, unspecified: Secondary | ICD-10-CM | POA: Diagnosis not present

## 2023-12-01 LAB — LIPID PANEL
Chol/HDL Ratio: 2 ratio (ref 0.0–4.4)
Cholesterol, Total: 183 mg/dL (ref 100–199)
HDL: 91 mg/dL (ref >39)
LDL Chol Calc (NIH): 76 mg/dL (ref 0–99)
Triglycerides: 89 mg/dL (ref 0–149)
VLDL Cholesterol Cal: 16 mg/dL (ref 5–40)

## 2023-12-01 LAB — ALT: ALT: 14 IU/L (ref 0–32)

## 2023-12-06 DIAGNOSIS — N390 Urinary tract infection, site not specified: Secondary | ICD-10-CM | POA: Diagnosis not present

## 2023-12-18 ENCOUNTER — Other Ambulatory Visit: Payer: Self-pay | Admitting: Cardiology

## 2023-12-18 DIAGNOSIS — E785 Hyperlipidemia, unspecified: Secondary | ICD-10-CM

## 2023-12-18 DIAGNOSIS — I700 Atherosclerosis of aorta: Secondary | ICD-10-CM

## 2024-01-05 DIAGNOSIS — H179 Unspecified corneal scar and opacity: Secondary | ICD-10-CM | POA: Diagnosis not present

## 2024-01-05 DIAGNOSIS — H2513 Age-related nuclear cataract, bilateral: Secondary | ICD-10-CM | POA: Diagnosis not present

## 2024-01-05 DIAGNOSIS — H43393 Other vitreous opacities, bilateral: Secondary | ICD-10-CM | POA: Diagnosis not present

## 2024-01-05 DIAGNOSIS — H35372 Puckering of macula, left eye: Secondary | ICD-10-CM | POA: Diagnosis not present

## 2024-01-05 DIAGNOSIS — H10413 Chronic giant papillary conjunctivitis, bilateral: Secondary | ICD-10-CM | POA: Diagnosis not present

## 2024-01-15 ENCOUNTER — Other Ambulatory Visit: Payer: Self-pay | Admitting: Cardiology

## 2024-01-15 DIAGNOSIS — E785 Hyperlipidemia, unspecified: Secondary | ICD-10-CM

## 2024-01-15 DIAGNOSIS — I700 Atherosclerosis of aorta: Secondary | ICD-10-CM

## 2024-01-19 DIAGNOSIS — M25511 Pain in right shoulder: Secondary | ICD-10-CM | POA: Diagnosis not present

## 2024-02-08 DIAGNOSIS — L821 Other seborrheic keratosis: Secondary | ICD-10-CM | POA: Diagnosis not present

## 2024-02-08 DIAGNOSIS — D485 Neoplasm of uncertain behavior of skin: Secondary | ICD-10-CM | POA: Diagnosis not present

## 2024-02-18 DIAGNOSIS — D122 Benign neoplasm of ascending colon: Secondary | ICD-10-CM | POA: Diagnosis not present

## 2024-02-18 DIAGNOSIS — Z09 Encounter for follow-up examination after completed treatment for conditions other than malignant neoplasm: Secondary | ICD-10-CM | POA: Diagnosis not present

## 2024-02-18 DIAGNOSIS — D123 Benign neoplasm of transverse colon: Secondary | ICD-10-CM | POA: Diagnosis not present

## 2024-02-18 DIAGNOSIS — Z860101 Personal history of adenomatous and serrated colon polyps: Secondary | ICD-10-CM | POA: Diagnosis not present

## 2024-02-22 DIAGNOSIS — D122 Benign neoplasm of ascending colon: Secondary | ICD-10-CM | POA: Diagnosis not present

## 2024-02-22 DIAGNOSIS — D123 Benign neoplasm of transverse colon: Secondary | ICD-10-CM | POA: Diagnosis not present

## 2024-02-29 NOTE — Progress Notes (Unsigned)
 Cardiology  Note    Date:  03/01/2024   ID:  Angel Warren October 08, 1950, MRN [id number]  PCP:  Angel Channel, MD  Cardiologist:  Angel Bihari, MD   Chief Complaint  Patient presents with   Hyperlipidemia   Coronary Artery Disease   Mitral Regurgitation    History of Present Illness:  Angel Warren is a 73 y.o. female with a history of anxiety and depression, hyperlipidemia, osteopenia, CKD stage III who was initially referred for  evaluation of hyperlipidemia per request of patient.  She is also had had some episodes of atypical chest discomfort in the past.   She has a family history of hypertension and hyperlipidemia.    Coronary CTA done 10/20/2021 showed a coronary calcium score of 119 with 25 to 49% proximal LAD and aortic atherosclerosis.  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  She is back today for follow-up and is doing well.  She denies any chest pain or pressure, shortness of breath, DOE (except when hiking up hill), PND, orthopnea, lower extremity edema (except when she flys), dizziness or syncope. She has had a few episodes where she will feel her heart flutter for 5 seconds and resolves.  I is very rare in occurrence only occurring 3-4 times in the past 2 years.   Past Medical History:  Diagnosis Date   Allergic rhinitis    Anxiety    Aortic atherosclerosis (HCC)    Atrophic vaginitis    CAD (coronary artery disease), native coronary artery    Coronary CTA showed an elevated coronary calcium score of 119 with mild nonobstructive calcified plaque in the proximal LAD with 25 to 49% stenosis by CTA 10/2021   Chronic kidney disease (CKD), stage III (moderate) (HCC)    COVID    Genital herpes simplex    Grief    Hyperlipidemia    Interstitial cystitis    Mitral valve prolapse    bileaflet with mild MR by echo 10/2021   Osteoarthritis of both knees     Overactive bladder    Seasonal allergies    Tinnitus of both ears    Urge incontinence    Vaginal dryness, menopausal     Past Surgical History:  Procedure Laterality Date   KNEE SURGERY Left 2018   2019 for right kee   TONSILLECTOMY     WISDOM TOOTH EXTRACTION      Current Medications: Current Meds  Medication Sig   alendronate (FOSAMAX) 70 MG tablet Take 70 mg by mouth once a week. Take with a full glass of water on an empty stomach.   aspirin  EC 81 MG tablet Take 1 tablet (81 mg total) by mouth every other day. Swallow whole.   calcium carbonate (SUPER CALCIUM) 1500 (600 Ca) MG TABS tablet 2 tablets Orally twice a day   cetirizine (ZYRTEC) 10 MG tablet Take 10 mg by mouth daily.   Evolocumab  (REPATHA  SURECLICK) 140 MG/ML SOAJ INJECT 140 MG INTO THE SKIN EVERY 14 (FOURTEEN) DAYS.   ezetimibe  (ZETIA ) 10 MG tablet Take 0.5 tablets (5 mg total) by mouth daily.   fluticasone (FLONASE) 50 MCG/ACT nasal spray Place 2 sprays into both nostrils 3 times/day as needed-between meals & bedtime for allergies or rhinitis.   Methylcobalamin (B12-ACTIVE PO) Take by mouth.   OVER THE COUNTER MEDICATION Take by mouth in the morning and at bedtime. BLADDER BUILDER  valACYclovir (VALTREX) 500 MG tablet Take 500 mg by mouth as needed (HERPES).   VITAMIN D PO Take 25 mcg by mouth.    Allergies:   Augmentin [amoxicillin-pot clavulanate], Cefdinir, Celecoxib, Levaquin [levofloxacin], Myrbetriq [mirabegron], Penicillins, Statins, Sulfamethoxazole, and Yuvafem [estradiol]   Social History   Socioeconomic History   Marital status: Single    Spouse name: Not on file   Number of children: Not on file   Years of education: Not on file   Highest education level: Not on file  Occupational History   Not on file  Tobacco Use   Smoking status: Never   Smokeless tobacco: Never  Vaping Use   Vaping status: Never Used  Substance and Sexual Activity   Alcohol use: Yes    Comment: OCCASIONALLY    Drug use: Not Currently   Sexual activity: Not Currently  Other Topics Concern   Not on file  Social History Narrative   Not on file   Social Drivers of Health   Financial Resource Strain: Not on file  Food Insecurity: Not on file  Transportation Needs: Not on file  Physical Activity: Not on file  Stress: Not on file  Social Connections: Not on file     Family History:  The patient's family history includes Asthma in her maternal grandmother; Cancer in her father and mother; Heart attack in her maternal grandfather; Heart disease in her paternal grandmother; Hypertension in her brother and father.   ROS:   Please see the history of present illness.    ROS All other systems reviewed and are negative.      No data to display             PHYSICAL EXAM:   VS:  BP 104/64   Pulse 65   Ht 5' (1.524 m)   Wt 133 lb 9.6 oz (60.6 kg)   SpO2 99%   BMI 26.09 kg/m    GEN: Well nourished, well developed in no acute distress HEENT: Normal NECK: No JVD; No carotid bruits LYMPHATICS: No lymphadenopathy CARDIAC:RRR, no murmurs, rubs, gallops RESPIRATORY:  Clear to auscultation without rales, wheezing or rhonchi  ABDOMEN: Soft, non-tender, non-distended MUSCULOSKELETAL:  No edema; No deformity  SKIN: Warm and dry NEUROLOGIC:  Alert and oriented x 3 PSYCHIATRIC:  Normal affect  Wt Readings from Last 3 Encounters:  03/01/24 133 lb 9.6 oz (60.6 kg)  08/26/22 131 lb (59.4 kg)  09/19/21 129 lb 12.8 oz (58.9 kg)      Studies/Labs Reviewed:    Recent Labs: 12/01/2023: ALT 14   Lipid Panel    Component Value Date/Time   CHOL 183 12/01/2023 0911   TRIG 89 12/01/2023 0911   HDL 91 12/01/2023 0911   CHOLHDL 2.0 12/01/2023 0911   LDLCALC 76 12/01/2023 0911     Additional studies/ records that were reviewed today include:  Office visit notes and labs from PCP    ASSESSMENT:    1. Hyperlipidemia LDL goal <70   2. Coronary artery disease involving native coronary  artery of native heart without angina pectoris   3. Mitral valve prolapse   4. Aortic atherosclerosis (HCC)       PLAN:  In order of problems listed above:  #Hyperlipidemia -LDL goal <70 -I have personally reviewed and interpreted outside labs performed by patient's PCP which showed LDL 76, HDL 91 and triglycerides 89 on 12/01/2023, ALT 14 -Continue Repatha  and Zetia  10 mg daily with as needed refills -she can only tolerate the Zetia   5mg  4 times weekly without diarrhea -seeing PharmD and Nexlitol not an option due to a tendon injury -I will repeat FLP and ALT today  #History of atypical chest pain #ASCAD - Coronary CTA done 10/20/2021 showed a coronary calcium score of 119 with 25 to 49% proximal LAD and aortic atherosclerosis. 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 -Will repeat 2D echo to make sure this is stable  #Aortic atherosclerosis - Continue statin therapy  Time Spent: 20 minutes total time of encounter, including 15 minutes spent in face-to-face patient care on the date of this encounter. This time includes coordination of care and counseling regarding above mentioned problem list. Remainder of non-face-to-face time involved reviewing chart documents/testing relevant to the patient encounter and documentation in the medical record. I have independently reviewed documentation from referring provider  Medication Adjustments/Labs and Tests Ordered: Current medicines are reviewed at length with the patient today.  Concerns regarding medicines are outlined above.  Medication changes, Labs and Tests ordered today are listed in the Patient Instructions below.  There are no Patient Instructions on file for this  visit.   Signed, Angel Bihari, MD  03/01/2024 10:03 AM    [facility] [address] [place], [place], [place]  [address] Phone: [phone]; Fax: [phone]

## 2024-03-01 ENCOUNTER — Encounter: Payer: Self-pay | Admitting: Cardiology

## 2024-03-01 ENCOUNTER — Ambulatory Visit: Attending: Cardiology | Admitting: Cardiology

## 2024-03-01 VITALS — BP 104/64 | HR 65 | Ht 60.0 in | Wt 133.6 lb

## 2024-03-01 DIAGNOSIS — E785 Hyperlipidemia, unspecified: Secondary | ICD-10-CM

## 2024-03-01 DIAGNOSIS — I700 Atherosclerosis of aorta: Secondary | ICD-10-CM | POA: Diagnosis not present

## 2024-03-01 DIAGNOSIS — I2510 Atherosclerotic heart disease of native coronary artery without angina pectoris: Secondary | ICD-10-CM | POA: Diagnosis not present

## 2024-03-01 DIAGNOSIS — Z79899 Other long term (current) drug therapy: Secondary | ICD-10-CM

## 2024-03-01 DIAGNOSIS — I341 Nonrheumatic mitral (valve) prolapse: Secondary | ICD-10-CM | POA: Diagnosis not present

## 2024-03-01 LAB — LIPID PANEL

## 2024-03-01 NOTE — Addendum Note (Signed)
 Addended by: JANIT GENI CROME on: 03/01/2024 10:18 AM   Modules accepted: Orders

## 2024-03-01 NOTE — Patient Instructions (Addendum)
 Medication Instructions:  Your physician recommends that you continue on your current medications as directed. Please refer to the Current Medication list given to you today.  *If you need a refill on your cardiac medications before your next appointment, please call your pharmacy*  Lab Work: Please complete a FASTING lipid panel and an ALT today in our first floor lab before you leave.  If you have labs (blood work) drawn today and your tests are completely normal, you will receive your results only by: MyChart Message (if you have MyChart) OR A paper copy in the mail If you have any lab test that is abnormal or we need to change your treatment, we will call you to review the results.  Testing/Procedures: Your physician has requested that you have an echocardiogram. Echocardiography is a painless test that uses sound waves to create images of your heart. It provides your doctor with information about the size and shape of your heart and how well your heart's chambers and valves are working. This procedure takes approximately one hour. There are no restrictions for this procedure. Please do NOT wear cologne, perfume, aftershave, or lotions (deodorant is allowed). Please arrive 15 minutes prior to your appointment time.  Please note: We ask at that you not bring children with you during ultrasound (echo/ vascular) testing. Due to room size and safety concerns, children are not allowed in the ultrasound rooms during exams. Our front office staff cannot provide observation of children in our lobby area while testing is being conducted. An adult accompanying a patient to their appointment will only be allowed in the ultrasound room at the discretion of the ultrasound technician under special circumstances. We apologize for any inconvenience.   Follow-Up: At [facility], you and your health needs are our priority.  As part of our continuing mission to provide you with exceptional heart  care, our providers are all part of one team.  This team includes your primary Cardiologist (physician) and Advanced Practice Providers or APPs (Physician Assistants and Nurse Practitioners) who all work together to provide you with the care you need, when you need it.  Your next appointment:   1 year(s)  Provider:   Wilbert Bihari, MD    We recommend signing up for the patient portal called MyChart.  Sign up information is provided on this After Visit Summary.  MyChart is used to connect with patients for Virtual Visits (Telemedicine).  Patients are able to view lab/test results, encounter notes, upcoming appointments, etc.  Non-urgent messages can be sent to your provider as well.   To learn more about what you can do with MyChart, go to [email or web address].   Please review the following information about [employer] products:  [email or web address]

## 2024-03-02 ENCOUNTER — Ambulatory Visit: Payer: Self-pay | Admitting: Cardiology

## 2024-03-02 ENCOUNTER — Other Ambulatory Visit: Payer: Self-pay

## 2024-03-02 DIAGNOSIS — I2510 Atherosclerotic heart disease of native coronary artery without angina pectoris: Secondary | ICD-10-CM

## 2024-03-02 DIAGNOSIS — E785 Hyperlipidemia, unspecified: Secondary | ICD-10-CM

## 2024-03-02 LAB — LIPID PANEL
Cholesterol, Total: 181 mg/dL (ref 100–199)
HDL: 81 mg/dL (ref >39)
LDL CALC COMMENT:: 2.2 ratio (ref 0.0–4.4)
LDL Chol Calc (NIH): 77 mg/dL (ref 0–99)
Triglycerides: 136 mg/dL (ref 0–149)
VLDL Cholesterol Cal: 23 mg/dL (ref 5–40)

## 2024-03-02 LAB — ALT: ALT: 19 IU/L (ref 0–32)

## 2024-03-02 NOTE — Progress Notes (Signed)
Referral to lipid clinic placed.

## 2024-03-13 DIAGNOSIS — L309 Dermatitis, unspecified: Secondary | ICD-10-CM | POA: Diagnosis not present

## 2024-03-20 DIAGNOSIS — Z713 Dietary counseling and surveillance: Secondary | ICD-10-CM | POA: Diagnosis not present

## 2024-03-21 DIAGNOSIS — M25561 Pain in right knee: Secondary | ICD-10-CM | POA: Diagnosis not present

## 2024-03-21 DIAGNOSIS — M25511 Pain in right shoulder: Secondary | ICD-10-CM | POA: Diagnosis not present

## 2024-03-27 ENCOUNTER — Other Ambulatory Visit: Payer: Medicare PPO

## 2024-04-07 ENCOUNTER — Other Ambulatory Visit (HOSPITAL_COMMUNITY)

## 2024-04-07 DIAGNOSIS — F4322 Adjustment disorder with anxiety: Secondary | ICD-10-CM | POA: Diagnosis not present

## 2024-04-24 ENCOUNTER — Ambulatory Visit: Attending: Cardiovascular Disease | Admitting: Pharmacist

## 2024-04-24 ENCOUNTER — Ambulatory Visit (HOSPITAL_COMMUNITY)
Admission: RE | Admit: 2024-04-24 | Discharge: 2024-04-24 | Disposition: A | Discharge status: Home / Self Care | Source: Ambulatory Visit | Attending: Cardiology | Admitting: Cardiology

## 2024-04-24 ENCOUNTER — Encounter: Payer: Self-pay | Admitting: Pharmacist

## 2024-04-24 ENCOUNTER — Encounter: Payer: Self-pay | Admitting: Cardiology

## 2024-04-24 ENCOUNTER — Ambulatory Visit: Admitting: Pharmacist

## 2024-04-24 DIAGNOSIS — E785 Hyperlipidemia, unspecified: Secondary | ICD-10-CM

## 2024-04-24 DIAGNOSIS — I341 Nonrheumatic mitral (valve) prolapse: Secondary | ICD-10-CM | POA: Diagnosis not present

## 2024-04-24 LAB — ECHOCARDIOGRAM COMPLETE: S' Lateral: 2.79 cm

## 2024-04-24 MED ORDER — PRAVASTATIN SODIUM 20 MG PO TABS: 20.0000 mg | ORAL_TABLET | Freq: Every evening | ORAL | 3 refills | Status: AC

## 2024-04-24 NOTE — Assessment & Plan Note (Signed)
 Assessment and plan:  LDL still above the goal while on Repatha  140 gm every 14 days along with Zetia  5 mg 3 times per week - last lab LDLc 77. Higher than current dose Zetia  causes GI intolerances. Patient wants to lower LDL below 70. Due to past tendon injury afraid of truing bempedoic acid. After lengthy discussion patient is willing to try different statin than previously tried ( intolerance to atorvastatin 20 gm and Crestor 5 mg 3 times per week). Pt to start taking pravastatin  20 mg and report us  id not tolerated well. Stop taking Zetia  when try Pravastatin  and continue taking Repatha . Follow up lab is due in 3 months of therapy modification.

## 2024-04-24 NOTE — Progress Notes (Signed)
 Patient ID: Angel Warren                 DOB: 1950/10/19                    MRN: [id number]      HPI: Angel Warren is a 73 y.o. female patient referred to lipid clinic by Dr.Turner . PMH is significant for  CAD, HLD, CAC -110 (72 nd percentile),mitral valve abnormality.   Patient was last seen in December 2024. At that time, she had previously trialed atorvastatin 20 mg but discontinued due to myalgia. She was then initiated on Crestor 5 mg daily, which also led to similar symptoms, prompting a dose reduction to 5 mg three times per week. The patient was subsequently started on Zetia , but experienced significant gastrointestinal intolerance, primarily diarrhea. Her maximum tolerated dose of Zetia  is 5 mg three times per week, which she reports causes manageable GI symptoms. While on this regimen--Zetia  5 mg three times per week in combination with Repatha --her LDL-C was approximately 78 mg/dL. During today's visit, we reviewed the benefits and risks of various statin therapies and discussed alternative LDL-lowering options, including ezetimibe , PCSK9 inhibitors, bempedoic acid, and inclisiran. We covered mechanisms of action, dosing schedules, potential side effects, and expected LDL-C reductions. Cost considerations and patient assistance programs were also reviewed to support access to therapy.  Patient is in agreement to try different low dose statin.    Current Medications: Repatha  140 mg SQ Q14D Intolerances: statins- myalgia, Zetia  - severe diarrhea  Risk Factors: CAD,HDL,CAC score >100  LDL goal: <70 mg/dl  Last LDLc : 79  Diet: Mediterranean diet   Exercise: 4 times a week - yoga class and pilate class, loves to hike and walk   Family History:  Relation Problem Comments  Mother (Deceased) Cancer     Father (Deceased) Cancer   Hypertension     Sister Metallurgist)   Sister Metallurgist)   Sister Metallurgist)   Brother Metallurgist) Hypertension     Brother Metallurgist)   Brother  (Alive)   Maternal Grandmother (Deceased) Asthma     Maternal Grandfather (Deceased) Heart attack     Paternal Grandmother (Deceased) Heart disease     Paternal Grandfather (Deceased)       Labs:  Lipid Panel     Component Value Date/Time   CHOL 181 03/01/2024 1048   TRIG 136 03/01/2024 1048   HDL 81 03/01/2024 1048   CHOLHDL 2.2 03/01/2024 1048   LDLCALC 77 03/01/2024 1048   LABVLDL 23 03/01/2024 1048    Past Medical History:  Diagnosis Date   Allergic rhinitis    Anxiety    Aortic atherosclerosis    Atrophic vaginitis    CAD (coronary artery disease), native coronary artery    Coronary CTA showed an elevated coronary calcium score of 119 with mild nonobstructive calcified plaque in the proximal LAD with 25 to 49% stenosis by CTA 10/2021   Chronic kidney disease (CKD), stage III (moderate) (HCC)    COVID    Genital herpes simplex    Grief    Hyperlipidemia    Interstitial cystitis    Mitral valve prolapse    bileaflet with mild MR by echo 10/2021   Osteoarthritis of both knees    Overactive bladder    Seasonal allergies    Tinnitus of both ears    Urge incontinence    Vaginal dryness, menopausal     Current Outpatient Medications on File Prior to  Visit  Medication Sig Dispense Refill   alendronate (FOSAMAX) 70 MG tablet Take 70 mg by mouth once a week. Take with a full glass of water on an empty stomach.     aspirin  EC 81 MG tablet Take 1 tablet (81 mg total) by mouth every other day. Swallow whole.     calcium carbonate (SUPER CALCIUM) 1500 (600 Ca) MG TABS tablet 2 tablets Orally twice a day     cetirizine (ZYRTEC) 10 MG tablet Take 10 mg by mouth daily.     Evolocumab  (REPATHA  SURECLICK) 140 MG/ML SOAJ INJECT 140 MG INTO THE SKIN EVERY 14 (FOURTEEN) DAYS. 6 mL 1   ezetimibe  (ZETIA ) 10 MG tablet Take 0.5 tablets (5 mg total) by mouth daily. 45 tablet 3   fluticasone (FLONASE) 50 MCG/ACT nasal spray Place 2 sprays into both nostrils 3 times/day as  needed-between meals & bedtime for allergies or rhinitis.     Methylcobalamin (B12-ACTIVE PO) Take by mouth.     OVER THE COUNTER MEDICATION Take by mouth in the morning and at bedtime. BLADDER BUILDER     valACYclovir (VALTREX) 500 MG tablet Take 500 mg by mouth as needed (HERPES).     VITAMIN D PO Take 25 mcg by mouth.     No current facility-administered medications on file prior to visit.    Allergies  Allergen Reactions   Augmentin [Amoxicillin-Pot Clavulanate] Hives   Cefdinir    Celecoxib Hives   Levaquin [Levofloxacin]     DIZZINESS    Myrbetriq [Mirabegron]     EDEMA   Penicillins    Statins    Sulfamethoxazole Hives   Yuvafem [Estradiol]     Assessment/Plan:  1. Hyperlipidemia -  Problem  Hyperlipidemia Ldl Goal <70   Current Medications: Repatha  140 mg SQ Q14D Intolerances: statins- myalgia, Zetia  - severe diarrhea  Risk Factors: CAD,HDL,CAC score >100  LDL goal: <70 mg/dl     Hyperlipidemia LDL goal <70 Assessment and plan:  LDL still above the goal while on Repatha  140 gm every 14 days along with Zetia  5 mg 3 times per week - last lab LDLc 77. Higher than current dose Zetia  causes GI intolerances. Patient wants to lower LDL below 70. Due to past tendon injury afraid of truing bempedoic acid. After lengthy discussion patient is willing to try different statin than previously tried ( intolerance to atorvastatin 20 gm and Crestor 5 mg 3 times per week). Pt to start taking pravastatin  20 mg and report us  id not tolerated well. Stop taking Zetia  when try Pravastatin  and continue taking Repatha . Follow up lab is due in 3 months of therapy modification.     Thank you,  Robbi Blanch, Pharm.D [place] [facility] [address], [place], [place] [address] Phone: [phone]; Fax: [phone]

## 2024-04-26 ENCOUNTER — Other Ambulatory Visit: Payer: Self-pay

## 2024-04-26 DIAGNOSIS — I341 Nonrheumatic mitral (valve) prolapse: Secondary | ICD-10-CM

## 2024-04-27 ENCOUNTER — Encounter: Payer: Self-pay | Admitting: Cardiology

## 2024-04-28 ENCOUNTER — Telehealth: Payer: Self-pay | Admitting: Pharmacist

## 2024-04-28 MED ORDER — EZETIMIBE 10 MG PO TABS: 10.0000 mg | ORAL_TABLET | Freq: Every day | ORAL | 3 refills | Status: AC

## 2024-04-28 NOTE — Addendum Note (Signed)
 Addended by: Rylin Seavey K on: 04/28/2024 09:09 AM   Modules accepted: Orders

## 2024-04-28 NOTE — Telephone Encounter (Signed)
 Patient called and LVM. Reported after taking 1st dose of pravastatin  she started having headache, joint and muscle pain and fatigue. Called patient, N/A LVM as per DPR. Advised to discontinue pravastatin . And restart Zetia  5 mg 3 times per week( this is max tolerated dose due to some GI intolerance). As per our last discussion due to past tendon injury she does not prefer bempedoic acid her LDLc 78 (goal <70) while on Repatha  140 mg every 14 days and Zetia  5 mg 3Xweek.

## 2024-04-28 NOTE — Telephone Encounter (Signed)
 Patient called back states she will try Zetia  10 mg daily as she tolerated 1/2 tab every day well for past few weeks.

## 2024-05-04 ENCOUNTER — Encounter: Payer: Self-pay | Admitting: Cardiology

## 2024-05-10 ENCOUNTER — Telehealth: Payer: Self-pay | Admitting: Pharmacist

## 2024-05-10 NOTE — Telephone Encounter (Signed)
 Patient concern addressed over the phone. Patient wants to get off of Zetia  and try OTC supplement and want to get lab checked mid Jan 2026.

## 2024-05-15 DIAGNOSIS — H43393 Other vitreous opacities, bilateral: Secondary | ICD-10-CM | POA: Diagnosis not present

## 2024-05-15 DIAGNOSIS — H35372 Puckering of macula, left eye: Secondary | ICD-10-CM | POA: Diagnosis not present

## 2024-05-15 DIAGNOSIS — H10413 Chronic giant papillary conjunctivitis, bilateral: Secondary | ICD-10-CM | POA: Diagnosis not present

## 2024-05-15 DIAGNOSIS — H179 Unspecified corneal scar and opacity: Secondary | ICD-10-CM | POA: Diagnosis not present

## 2024-05-15 DIAGNOSIS — H2513 Age-related nuclear cataract, bilateral: Secondary | ICD-10-CM | POA: Diagnosis not present

## 2024-05-25 DIAGNOSIS — M25511 Pain in right shoulder: Secondary | ICD-10-CM | POA: Diagnosis not present

## 2024-06-05 DIAGNOSIS — M19011 Primary osteoarthritis, right shoulder: Secondary | ICD-10-CM | POA: Diagnosis not present

## 2024-06-05 DIAGNOSIS — M75111 Incomplete rotator cuff tear or rupture of right shoulder, not specified as traumatic: Secondary | ICD-10-CM | POA: Diagnosis not present

## 2024-06-09 DIAGNOSIS — M75111 Incomplete rotator cuff tear or rupture of right shoulder, not specified as traumatic: Secondary | ICD-10-CM | POA: Diagnosis not present

## 2024-06-09 DIAGNOSIS — M19011 Primary osteoarthritis, right shoulder: Secondary | ICD-10-CM | POA: Diagnosis not present

## 2024-06-11 DIAGNOSIS — M25511 Pain in right shoulder: Secondary | ICD-10-CM | POA: Diagnosis not present

## 2024-06-13 DIAGNOSIS — M19011 Primary osteoarthritis, right shoulder: Secondary | ICD-10-CM | POA: Diagnosis not present

## 2024-06-13 DIAGNOSIS — M75111 Incomplete rotator cuff tear or rupture of right shoulder, not specified as traumatic: Secondary | ICD-10-CM | POA: Diagnosis not present

## 2024-06-13 DIAGNOSIS — F4323 Adjustment disorder with mixed anxiety and depressed mood: Secondary | ICD-10-CM | POA: Diagnosis not present

## 2024-06-16 DIAGNOSIS — M75111 Incomplete rotator cuff tear or rupture of right shoulder, not specified as traumatic: Secondary | ICD-10-CM | POA: Diagnosis not present

## 2024-06-16 DIAGNOSIS — M19011 Primary osteoarthritis, right shoulder: Secondary | ICD-10-CM | POA: Diagnosis not present

## 2024-06-20 DIAGNOSIS — M25511 Pain in right shoulder: Secondary | ICD-10-CM | POA: Diagnosis not present

## 2024-06-20 DIAGNOSIS — M75101 Unspecified rotator cuff tear or rupture of right shoulder, not specified as traumatic: Secondary | ICD-10-CM | POA: Diagnosis not present

## 2024-06-20 DIAGNOSIS — M12811 Other specific arthropathies, not elsewhere classified, right shoulder: Secondary | ICD-10-CM | POA: Diagnosis not present

## 2024-06-21 ENCOUNTER — Telehealth (HOSPITAL_BASED_OUTPATIENT_CLINIC_OR_DEPARTMENT_OTHER): Payer: Self-pay | Admitting: *Deleted

## 2024-06-21 DIAGNOSIS — F4323 Adjustment disorder with mixed anxiety and depressed mood: Secondary | ICD-10-CM | POA: Diagnosis not present

## 2024-06-21 NOTE — Telephone Encounter (Signed)
   Pre-operative Risk Assessment    Patient Name: Angel Warren  DOB: May 12, 1951 MRN: [id number]   Date of last office visit: 03/01/2024 Date of next office visit: None Request for Surgical Clearance    Procedure:  right reverse total shoulder arthroplasty   Date of Surgery:  Clearance TBD                                 Surgeon:  Orville Her Surgeon's Group or Practice Name:  [facility] Phone number:  [phone] Fax number:  [phone]   Type of Clearance Requested:   - Medical  - Pharmacy:  Hold Aspirin  Not indicated   Type of Anesthesia:  Choice   Additional requests/questions:    Signed, Edsel Grayce Sanders   06/21/2024, 1:11 PM

## 2024-06-21 NOTE — Telephone Encounter (Signed)
   Name: Angel Warren  DOB: 13-Aug-1950  MRN: [id number]  Primary Cardiologist: Wilbert Bihari, MD   Preoperative team, please contact this patient and set up a phone call appointment for further preoperative risk assessment. Please obtain consent and complete medication review. Thank you for your help.  I confirm that guidance regarding antiplatelet and oral anticoagulation therapy has been completed and, if necessary, noted below.  Per office protocol, if patient is without any new symptoms or concerns at the time of their virtual visit, she may hold ASA for 7 days prior to procedure. Please resume ASA as soon as possible postprocedure, at the discretion of the surgeon.    I also confirmed the patient resides in the state of [place] . As per [facility] Medical Board telemedicine laws, the patient must reside in the state in which the provider is licensed.   Lamarr Satterfield, NP 06/21/2024, 2:16 PM [facility]

## 2024-06-21 NOTE — Telephone Encounter (Signed)
 Spoke to patient and she was not happy to have this appointment scheduled. She was under the impression she needed to come into the office again and was not happy. I informed her she would just need a telephone clearance appointment. Her medication list was updated and she told me that if anything needs to be added she will let the nurse know when she speaks to her. She also informed me that she was started on Meloxicam and still takes Aspirin  and wants to know if she should still continue the Asprin and I told her I was not sure. She was not happy that I didn't have an answer for her and she said she will just ask the nurse during her phone call because she would know.   She is scheduled for clearance on 06/23/24 with Orren Fabry, PA-C    Patient Consent for Virtual Visit        Angel Warren has provided verbal consent on 06/21/2024 for a virtual visit (video or telephone).   CONSENT FOR VIRTUAL VISIT FOR:  Angel Warren  By participating in this virtual visit I agree to the following:  I hereby voluntarily request, consent and authorize [facility] and its employed or contracted physicians, physician assistants, nurse practitioners or other licensed health care professionals (the Practitioner), to provide me with telemedicine health care services (the "Services) as deemed necessary by the treating Practitioner. I acknowledge and consent to receive the Services by the Practitioner via telemedicine. I understand that the telemedicine visit will involve communicating with the Practitioner through live audiovisual communication technology and the disclosure of certain medical information by electronic transmission. I acknowledge that I have been given the opportunity to request an in-person assessment or other available alternative prior to the telemedicine visit and am voluntarily participating in the telemedicine visit.  I understand that I have the right to withhold  or withdraw my consent to the use of telemedicine in the course of my care at any time, without affecting my right to future care or treatment, and that the Practitioner or I may terminate the telemedicine visit at any time. I understand that I have the right to inspect all information obtained and/or recorded in the course of the telemedicine visit and may receive copies of available information for a reasonable fee.  I understand that some of the potential risks of receiving the Services via telemedicine include:  Delay or interruption in medical evaluation due to technological equipment failure or disruption; Information transmitted may not be sufficient (e.g. poor resolution of images) to allow for appropriate medical decision making by the Practitioner; and/or  In rare instances, security protocols could fail, causing a breach of personal health information.  Furthermore, I acknowledge that it is my responsibility to provide information about my medical history, conditions and care that is complete and accurate to the best of my ability. I acknowledge that Practitioner's advice, recommendations, and/or decision may be based on factors not within their control, such as incomplete or inaccurate data provided by me or distortions of diagnostic images or specimens that may result from electronic transmissions. I understand that the practice of medicine is not an exact science and that Practitioner makes no warranties or guarantees regarding treatment outcomes. I acknowledge that a copy of this consent can be made available to me via my patient portal [facility] MyChart), or I can request a printed copy by calling the office of [facility].    I understand that my  insurance will be billed for this visit.   I have read or had this consent read to me. I understand the contents of this consent, which adequately explains the benefits and risks of the Services being provided via telemedicine.  I  have been provided ample opportunity to ask questions regarding this consent and the Services and have had my questions answered to my satisfaction. I give my informed consent for the services to be provided through the use of telemedicine in my medical care

## 2024-06-22 NOTE — Telephone Encounter (Signed)
 I s/w the pt who asked if she could do her tele later today. I stated that we did not have anything later. I did say that we could later on 06/27/24. Pt opted to just keep the appt today at 1:40 with Tessa.

## 2024-06-22 NOTE — Telephone Encounter (Signed)
 Pt requesting to change her tele visit for today later in the afternoon.

## 2024-06-23 ENCOUNTER — Other Ambulatory Visit: Payer: Self-pay | Admitting: Orthopaedic Surgery

## 2024-06-23 ENCOUNTER — Ambulatory Visit: Attending: Internal Medicine | Admitting: Physician Assistant

## 2024-06-23 DIAGNOSIS — Z01818 Encounter for other preprocedural examination: Secondary | ICD-10-CM | POA: Diagnosis not present

## 2024-06-23 DIAGNOSIS — Z01810 Encounter for preprocedural cardiovascular examination: Secondary | ICD-10-CM | POA: Diagnosis not present

## 2024-06-23 DIAGNOSIS — M75111 Incomplete rotator cuff tear or rupture of right shoulder, not specified as traumatic: Secondary | ICD-10-CM | POA: Diagnosis not present

## 2024-06-23 DIAGNOSIS — M19011 Primary osteoarthritis, right shoulder: Secondary | ICD-10-CM

## 2024-06-23 DIAGNOSIS — M25511 Pain in right shoulder: Secondary | ICD-10-CM | POA: Diagnosis not present

## 2024-06-23 NOTE — Progress Notes (Signed)
 Virtual Visit via Telephone Note   Because of [name] co-morbid illnesses, she is at least at moderate risk for complications without adequate follow up.  This format is felt to be most appropriate for this patient at this time.  Due to technical limitations with video connection (technology), today's appointment will be conducted as an audio only telehealth visit, and [name] verbally agreed to proceed in this manner.   All issues noted in this document were discussed and addressed.  No physical exam could be performed with this format.  Evaluation Performed:  Preoperative cardiovascular risk assessment _____________   Date:  06/23/2024   Patient ID:  Angel Warren, DOB 01-14-1951, MRN [id number] Patient Location:  Home Provider location:   Office  Primary Care Provider:  Teresa Channel, MD Primary Cardiologist:  Wilbert Bihari, MD  Chief Complaint / Patient Profile   73 y.o. y/o female with a h/o CAD with high calcium score, anxiety and depression, hyperlipidemia, hypertension, osteopenia, CKD stage III who is pending right reverse total shoulder arthroplasty and presents today for telephonic preoperative cardiovascular risk assessment.  History of Present Illness    Angel Warren is a 73 y.o. female who presents via audio/video conferencing for a telehealth visit today.  Pt was last seen in cardiology clinic on 03/01/24 by Dr. Bihari.  At that time [name] was doing well other than a rare heart flutter.  The patient is now pending procedure as outlined above. Since her last visit, she tells me she has not had any issues with her heart.  She was referred to Dr. Bihari because of her familial hypercholesterolemia.  She also has some hypertension and has nonobstructive CAD found on CT scan back in 2023.  She remains very active hiking, walking, swimming, biking, gardening, playing with her grandchildren.  She well surpasses METS due to her  level of activity.  Now her surgeon is Dr. Cristy.  Fax for [name] [phone], this is where I will send my clearance.  Per office protocol, if patient is without any new symptoms or concerns at the time of their virtual visit, she may hold ASA for 7 days prior to procedure. Please resume ASA as soon as possible postprocedure, at the discretion of the surgeon.     Past Medical History    Past Medical History:  Diagnosis Date   Allergic rhinitis    Anxiety    Aortic atherosclerosis    Atrophic vaginitis    CAD (coronary artery disease), native coronary artery    Coronary CTA showed an elevated coronary calcium score of 119 with mild nonobstructive calcified plaque in the proximal LAD with 25 to 49% stenosis by CTA 10/2021   Chronic kidney disease (CKD), stage III (moderate) (HCC)    COVID    Genital herpes simplex    Grief    Hyperlipidemia    Interstitial cystitis    Mitral valve prolapse    bileaflet MVP with mild MR by echo 04/2024   Osteoarthritis of both knees    Overactive bladder    Seasonal allergies    Tinnitus of both ears    Urge incontinence    Vaginal dryness, menopausal    Past Surgical History:  Procedure Laterality Date   KNEE SURGERY Left 2018   2019 for right kee   TONSILLECTOMY     WISDOM TOOTH EXTRACTION      Allergies  Allergies  Allergen Reactions   Augmentin [Amoxicillin-Pot Clavulanate] Hives  Cefdinir    Celecoxib Hives   Levaquin [Levofloxacin]     DIZZINESS    Myrbetriq [Mirabegron]     EDEMA   Penicillins    Statins    Sulfamethoxazole Hives   Yuvafem [Estradiol]     Home Medications    Prior to Admission medications   Medication Sig Start Date End Date Taking? Authorizing Provider  alendronate (FOSAMAX) 70 MG tablet Take 70 mg by mouth once a week. Take with a full glass of water on an empty stomach.    [provider]  aspirin  EC 81 MG tablet Take 1 tablet (81 mg total) by mouth every other day. Swallow  whole. 01/30/22   Shlomo Wilbert SAUNDERS, MD  Barberry-Oreg Grape-Goldenseal (BERBERINE COMPLEX PO) Take 2 capsules by mouth daily.    [provider]  calcium carbonate (SUPER CALCIUM) 1500 (600 Ca) MG TABS tablet 2 tablets Orally twice a day    [provider]  cetirizine (ZYRTEC) 10 MG tablet Take 10 mg by mouth daily. Patient taking differently: Take 10 mg by mouth as needed.    [provider]  Creatinine POWD by Does not apply route.    [provider]  Evolocumab  (REPATHA  SURECLICK) 140 MG/ML SOAJ INJECT 140 MG INTO THE SKIN EVERY 14 (FOURTEEN) DAYS. 01/18/24   Shlomo Wilbert SAUNDERS, MD  ezetimibe  (ZETIA ) 10 MG tablet Take 1 tablet (10 mg total) by mouth daily. Patient not taking: Reported on 06/21/2024 04/28/24   Shlomo Wilbert SAUNDERS, MD  fluticasone (FLONASE) 50 MCG/ACT nasal spray Place 2 sprays into both nostrils 3 times/day as needed-between meals & bedtime for allergies or rhinitis.    [provider]  meloxicam (MOBIC) 15 MG tablet Take 15 mg by mouth daily. 06/20/24   [provider]  Methylcobalamin (B12-ACTIVE PO) Take by mouth.    [provider]  OVER THE COUNTER MEDICATION Take by mouth in the morning and at bedtime. BLADDER BUILDER    [provider]  pravastatin  (PRAVACHOL ) 20 MG tablet Take 1 tablet (20 mg total) by mouth every evening. Patient not taking: Reported on 06/21/2024 04/24/24 07/23/24  Shlomo Wilbert SAUNDERS, MD  valACYclovir (VALTREX) 500 MG tablet Take 500 mg by mouth as needed (HERPES).    [provider]  VITAMIN D PO Take 25 mcg by mouth.    [provider]    Physical Exam    Vital Signs:  [name] does not have vital signs available for review today.  Given telephonic nature of communication, physical exam is limited. AAOx3. NAD. Normal affect.  Speech and respirations are unlabored.  Accessory Clinical Findings    None  Assessment & Plan    1.  Preoperative  Cardiovascular Risk Assessment:  Ms. Miner's perioperative risk of a major cardiac event is 0.4% according to the Revised Cardiac Risk Index (RCRI).  Therefore, she is at low risk for perioperative complications.   Her functional capacity is good at 6.55 METs according to the Duke Activity Status Index (DASI). Recommendations: According to ACC/AHA guidelines, no further cardiovascular testing needed.  The patient may proceed to surgery at acceptable risk.   Antiplatelet and/or Anticoagulation Recommendations: Aspirin  can be held for 7 days prior to her surgery.  Please resume Aspirin  post operatively when it is felt to be safe from a bleeding standpoint.   The patient was advised that if she develops new symptoms prior to surgery to contact our office to arrange for a follow-up visit, and she verbalized  understanding.   A copy of this note will be routed to requesting surgeon.  Time:   Today, I have spent 11 minutes with the patient with telehealth technology discussing medical history, symptoms, and management plan.     Orren LOISE Fabry, PA-C  06/23/2024, 7:56 AM

## 2024-07-03 ENCOUNTER — Encounter: Payer: Self-pay | Admitting: Orthopaedic Surgery

## 2024-07-05 ENCOUNTER — Inpatient Hospital Stay
Admission: RE | Admit: 2024-07-05 | Discharge: 2024-07-05 | Disposition: A | Source: Ambulatory Visit | Attending: Orthopaedic Surgery

## 2024-07-05 DIAGNOSIS — M19011 Primary osteoarthritis, right shoulder: Secondary | ICD-10-CM

## 2024-07-05 DIAGNOSIS — M8588 Other specified disorders of bone density and structure, other site: Secondary | ICD-10-CM | POA: Diagnosis not present

## 2024-07-05 DIAGNOSIS — E538 Deficiency of other specified B group vitamins: Secondary | ICD-10-CM | POA: Diagnosis not present

## 2024-07-05 DIAGNOSIS — G720 Drug-induced myopathy: Secondary | ICD-10-CM | POA: Diagnosis not present

## 2024-07-05 DIAGNOSIS — E785 Hyperlipidemia, unspecified: Secondary | ICD-10-CM | POA: Diagnosis not present

## 2024-07-05 DIAGNOSIS — Z01818 Encounter for other preprocedural examination: Secondary | ICD-10-CM | POA: Diagnosis not present

## 2024-07-05 DIAGNOSIS — I2510 Atherosclerotic heart disease of native coronary artery without angina pectoris: Secondary | ICD-10-CM | POA: Diagnosis not present

## 2024-07-05 DIAGNOSIS — M75121 Complete rotator cuff tear or rupture of right shoulder, not specified as traumatic: Secondary | ICD-10-CM | POA: Diagnosis not present

## 2024-07-11 ENCOUNTER — Other Ambulatory Visit: Payer: Self-pay | Admitting: Cardiology

## 2024-07-11 DIAGNOSIS — E785 Hyperlipidemia, unspecified: Secondary | ICD-10-CM

## 2024-07-11 DIAGNOSIS — I700 Atherosclerosis of aorta: Secondary | ICD-10-CM
